# Patient Record
Sex: Female | Born: 1980
Health system: Southern US, Community
[De-identification: ages and names within clinical notes are randomized; demographics above are authoritative.]

## PROBLEM LIST (undated history)

## (undated) DIAGNOSIS — C801 Malignant (primary) neoplasm, unspecified: Secondary | ICD-10-CM

## (undated) DIAGNOSIS — F410 Panic disorder [episodic paroxysmal anxiety] without agoraphobia: Secondary | ICD-10-CM

## (undated) DIAGNOSIS — F319 Bipolar disorder, unspecified: Secondary | ICD-10-CM

## (undated) DIAGNOSIS — C539 Malignant neoplasm of cervix uteri, unspecified: Secondary | ICD-10-CM

## (undated) DIAGNOSIS — J45909 Unspecified asthma, uncomplicated: Secondary | ICD-10-CM

## (undated) DIAGNOSIS — F1910 Other psychoactive substance abuse, uncomplicated: Secondary | ICD-10-CM

## (undated) DIAGNOSIS — L309 Dermatitis, unspecified: Secondary | ICD-10-CM

## (undated) DIAGNOSIS — F4481 Dissociative identity disorder: Secondary | ICD-10-CM

## (undated) DIAGNOSIS — F3175 Bipolar disorder, in partial remission, most recent episode depressed: Secondary | ICD-10-CM

## (undated) DIAGNOSIS — B1920 Unspecified viral hepatitis C without hepatic coma: Secondary | ICD-10-CM

---
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
## 2012-05-29 ENCOUNTER — Emergency Department (HOSPITAL_COMMUNITY)
Admission: EM | Admit: 2012-05-29 | Discharge: 2012-05-29 | Discharge status: Against Medical Advice | Payer: Self-pay | Attending: Emergency Medicine | Admitting: Emergency Medicine

## 2012-05-29 DIAGNOSIS — Z0389 Encounter for observation for other suspected diseases and conditions ruled out: Secondary | ICD-10-CM | POA: Insufficient documentation

## 2012-05-29 NOTE — ED Notes (Signed)
Called to triage-- no answer

## 2012-05-29 NOTE — ED Notes (Signed)
Called to traige no answer

## 2012-06-06 ENCOUNTER — Emergency Department (HOSPITAL_COMMUNITY)
Admission: EM | Admit: 2012-06-06 | Discharge: 2012-06-07 | Disposition: A | Discharge status: Other Acute Inpatient Hospital | Payer: Self-pay | Attending: Emergency Medicine | Admitting: Emergency Medicine

## 2012-06-06 ENCOUNTER — Encounter (HOSPITAL_COMMUNITY): Payer: Self-pay | Admitting: *Deleted

## 2012-06-06 DIAGNOSIS — F1010 Alcohol abuse, uncomplicated: Secondary | ICD-10-CM | POA: Insufficient documentation

## 2012-06-06 DIAGNOSIS — K089 Disorder of teeth and supporting structures, unspecified: Secondary | ICD-10-CM | POA: Insufficient documentation

## 2012-06-06 DIAGNOSIS — F319 Bipolar disorder, unspecified: Secondary | ICD-10-CM | POA: Insufficient documentation

## 2012-06-06 DIAGNOSIS — J45909 Unspecified asthma, uncomplicated: Secondary | ICD-10-CM | POA: Insufficient documentation

## 2012-06-06 DIAGNOSIS — F1110 Opioid abuse, uncomplicated: Secondary | ICD-10-CM | POA: Insufficient documentation

## 2012-06-06 DIAGNOSIS — F17200 Nicotine dependence, unspecified, uncomplicated: Secondary | ICD-10-CM | POA: Insufficient documentation

## 2012-06-06 HISTORY — DX: Unspecified asthma, uncomplicated: J45.909

## 2012-06-06 HISTORY — DX: Bipolar disorder, unspecified: F31.9

## 2012-06-06 LAB — COMPREHENSIVE METABOLIC PANEL
AST: 26 U/L (ref 0–37)
BUN: 8 mg/dL (ref 6–23)
CO2: 26 mEq/L (ref 19–32)
Calcium: 9.2 mg/dL (ref 8.4–10.5)
Creatinine, Ser: 0.66 mg/dL (ref 0.50–1.10)
GFR calc Af Amer: >90 mL/min (ref >90)
GFR calc non Af Amer: >90 mL/min (ref >90)

## 2012-06-06 LAB — ACETAMINOPHEN LEVEL: Acetaminophen (Tylenol), Serum: <15 ug/mL (ref 10–30)

## 2012-06-06 LAB — CBC
MCH: 31.9 pg (ref 26.0–34.0)
MCV: 88.7 fL (ref 78.0–100.0)
Platelets: 165 10*3/uL (ref 150–400)
RBC: 4.33 MIL/uL (ref 3.87–5.11)

## 2012-06-06 LAB — RAPID URINE DRUG SCREEN, HOSP PERFORMED
Amphetamines: NOT DETECTED
Opiates: POSITIVE — AB

## 2012-06-06 LAB — ETHANOL: Alcohol, Ethyl (B): <11 mg/dL (ref 0–11)

## 2012-06-06 NOTE — ED Notes (Signed)
Pt and belongings wanded by security. Pt has 7 bags in cabinet 1.

## 2012-06-06 NOTE — ED Notes (Signed)
PA at bedside.

## 2012-06-06 NOTE — ED Notes (Signed)
Pt bought all of her belongings because she doesn't have anywhere to leave them.  Some were put in locker 28 and the rest security put in activity room in the psych ED.

## 2012-06-06 NOTE — ED Notes (Signed)
Pt reports she is tired of using heroin and wants to stop for her children.  She reports feeling sad because her ex husband's girlfriend is saying unkind things about her to her children and she wants to get to the point where she can get her kids back.  Last drink 7pm last night, last use of heroin 2pm today.  Pt usually drinks a pint of alcohol per day in addition to her heroin use.

## 2012-06-06 NOTE — ED Notes (Signed)
Pt coming from home with c/o detox. Pt states she wants detox from ETOH and heroin. Pt reports last drink was 7 pm last night, and last use heroin was today at 2 pm. Pt reports drinking 6 "Bootleggers" a day. Pt states 6 Bootleggers is approxmentaely a pint or fifth of liquor a day. Pt reports having a lot of stress with family and friends lately.

## 2012-06-06 NOTE — ED Notes (Signed)
Pt has been wanded by security. 

## 2012-06-06 NOTE — ED Provider Notes (Signed)
History     CSN: [id number]  Arrival date & time 06/06/12  1758   First MD Initiated Contact with Patient 06/06/12 2141      Chief Complaint  Patient presents with  . Medical Clearance   HPI  History provided by the patient. Patient is a 31 year old female who presents with requests for help with alcohol and heroine detox. Patient admits to long standing heroine abuse as well as heavy EtOH abuse. Patient does state that she has been through detox program for oral narcotics in the past one to 2 years ago. Patient states that she relapsed and has been using steadily for the past year. Patient does report trying to wean herself off her alcohol use over the past 2 days. Last drink was yesterday night at 7 PM. Patient does report using heroin earlier today. Patient does report having some diarrhea symptoms currently otherwise has no other complaints at present. Patient requesting help with detox. She denies any SI or HI.    Past Medical History  Diagnosis Date  . Degenerative disk disease   . Asthma   . Bipolar disorder     History reviewed. No pertinent past surgical history.  No family history on file.  History  Substance Use Topics  . Smoking status: Current Everyday Smoker  . Smokeless tobacco: Not on file  . Alcohol Use: Yes    OB History    Grav Para Term Preterm Abortions TAB SAB Ect Mult Living                  Review of Systems  Constitutional: Negative for fever and chills.  Respiratory: Negative for shortness of breath.   Cardiovascular: Negative for chest pain.  Gastrointestinal: Positive for nausea and diarrhea.  Neurological: Positive for tremors. Negative for weakness and numbness.  Psychiatric/Behavioral: Negative for suicidal ideas, hallucinations, confusion and self-injury.    Allergies  Review of patient's allergies indicates no known allergies.  Home Medications   Current Outpatient Rx  Name Route Sig Dispense Refill  . ALPRAZOLAM 1 MG PO TABS  Oral Take 1 mg by mouth every 4 (four) hours as needed. For anxiety.    Marland Kitchen TACROLIMUS 0.1 % EX OINT Topical Apply 1 application topically 2 (two) times daily.    . TRAZODONE HCL 150 MG PO TABS Oral Take 75 mg by mouth once.      BP 123/97  Pulse 85  Temp 98.5 F (36.9 C) (Oral)  Resp 20  SpO2 100%  LMP 05/15/2012  Physical Exam  Nursing note and vitals reviewed. Constitutional: She is oriented to person, place, and time. She appears well-developed and well-nourished. No distress.  HENT:  Head: Normocephalic.  Mouth/Throat:    Neck: Normal range of motion. Neck supple.  Cardiovascular: Normal rate and regular rhythm.   Pulmonary/Chest: Effort normal and breath sounds normal. No respiratory distress. She has no wheezes. She has no rales.  Abdominal: Soft. There is no tenderness. There is no rebound and no guarding.  Lymphadenopathy:    She has no cervical adenopathy.  Neurological: She is alert and oriented to person, place, and time.  Skin: Skin is warm and dry.  Psychiatric: Her behavior is normal. Her mood appears anxious. She exhibits a depressed mood. She expresses no homicidal and no suicidal ideation.    ED Course  Procedures    Dental Block Performed by: Angus Seller Authorized by: Angus Seller Consent: Verbal consent obtained. Risks and benefits: risks, benefits and alternatives were discussed Consent  given by: patient Patient identity confirmed: provided demographic data  Location: Right lower second molar  Local anesthetic: Bupivacaine 0.5% with epinephrine  Anesthetic total: 1.8 ml  Irrigation method: syringe  Patient tolerance: Patient tolerated the procedure well with no immediate complications. Pain improved.  Dental Block Performed by: Angus Seller Authorized by: Angus Seller Consent: Verbal consent obtained. Risks and benefits: risks, benefits and alternatives were discussed Consent given by: patient Patient identity confirmed:  provided demographic data  Location: Left lower first molar  Local anesthetic: Bupivacaine 0.5% with epinephrine  Anesthetic total: 1.8 ml  Irrigation method: syringe  Patient tolerance: Patient tolerated the procedure well with no immediate complications. Pain improved.   Results for orders placed during the hospital encounter of 06/06/12  CBC      Component Value Range   WBC 8.2  4.0 - 10.5 K/uL   RBC 4.33  3.87 - 5.11 MIL/uL   Hemoglobin 13.8  12.0 - 15.0 g/dL   HCT 86.5  78.4 - 69.6 %   MCV 88.7  78.0 - 100.0 fL   MCH 31.9  26.0 - 34.0 pg   MCHC 35.9  30.0 - 36.0 g/dL   RDW 29.5  28.4 - 13.2 %   Platelets 165  150 - 400 K/uL  COMPREHENSIVE METABOLIC PANEL      Component Value Range   Sodium 137  135 - 145 mEq/L   Potassium 3.4 (*) 3.5 - 5.1 mEq/L   Chloride 101  96 - 112 mEq/L   CO2 26  19 - 32 mEq/L   Glucose, Bld 102 (*) 70 - 99 mg/dL   BUN 8  6 - 23 mg/dL   Creatinine, Ser 4.40  0.50 - 1.10 mg/dL   Calcium 9.2  8.4 - 10.2 mg/dL   Total Protein 7.3  6.0 - 8.3 g/dL   Albumin 4.0  3.5 - 5.2 g/dL   AST 26  0 - 37 U/L   ALT 24  0 - 35 U/L   Alkaline Phosphatase 74  39 - 117 U/L   Total Bilirubin 0.7  0.3 - 1.2 mg/dL   GFR calc non Af Amer >90  >90 mL/min   GFR calc Af Amer >90  >90 mL/min  ETHANOL      Component Value Range   Alcohol, Ethyl (B) <11  0 - 11 mg/dL  ACETAMINOPHEN LEVEL      Component Value Range   Acetaminophen (Tylenol), Serum <15.0  10 - 30 ug/mL  URINE RAPID DRUG SCREEN (HOSP PERFORMED)      Component Value Range   Opiates POSITIVE (*) NONE DETECTED   Cocaine NONE DETECTED  NONE DETECTED   Benzodiazepines NONE DETECTED  NONE DETECTED   Amphetamines NONE DETECTED  NONE DETECTED   Tetrahydrocannabinol POSITIVE (*) NONE DETECTED   Barbiturates NONE DETECTED  NONE DETECTED  POCT PREGNANCY, URINE      Component Value Range   Preg Test, Ur NEGATIVE  NEGATIVE      1. Heroin abuse   2. Alcohol abuse       MDM  Patient seen and  evaluated. Patient denies SI or HI. Patient requesting help with detox from heroin and EtOH.  Patient was seen and evaluated by [facility] act team. They will plan for inpatient placement for heroin detox  Psych holding orders in place.  Patient does complain of lower dental pains. Patient given dental blocks with improvement of pain.   Angus Seller, [place] 06/07/12 [phone]

## 2012-06-07 ENCOUNTER — Encounter (HOSPITAL_COMMUNITY): Payer: Self-pay | Admitting: *Deleted

## 2012-06-07 ENCOUNTER — Inpatient Hospital Stay (HOSPITAL_COMMUNITY)
Admission: AD | Admit: 2012-06-07 | Discharge: 2012-06-15 | DRG: 897 | Disposition: A | Discharge status: Home / Self Care | Payer: Federal, State, Local not specified - Other | Attending: Psychiatry | Admitting: Psychiatry

## 2012-06-07 DIAGNOSIS — F3175 Bipolar disorder, in partial remission, most recent episode depressed: Secondary | ICD-10-CM

## 2012-06-07 DIAGNOSIS — F603 Borderline personality disorder: Secondary | ICD-10-CM

## 2012-06-07 DIAGNOSIS — F319 Bipolar disorder, unspecified: Secondary | ICD-10-CM | POA: Diagnosis present

## 2012-06-07 DIAGNOSIS — G4700 Insomnia, unspecified: Secondary | ICD-10-CM | POA: Diagnosis present

## 2012-06-07 DIAGNOSIS — F10239 Alcohol dependence with withdrawal, unspecified: Secondary | ICD-10-CM | POA: Diagnosis present

## 2012-06-07 DIAGNOSIS — F10939 Alcohol use, unspecified with withdrawal, unspecified: Secondary | ICD-10-CM | POA: Diagnosis present

## 2012-06-07 DIAGNOSIS — IMO0002 Reserved for concepts with insufficient information to code with codable children: Secondary | ICD-10-CM | POA: Diagnosis present

## 2012-06-07 DIAGNOSIS — F1020 Alcohol dependence, uncomplicated: Secondary | ICD-10-CM | POA: Diagnosis present

## 2012-06-07 DIAGNOSIS — F1920 Other psychoactive substance dependence, uncomplicated: Secondary | ICD-10-CM | POA: Diagnosis present

## 2012-06-07 DIAGNOSIS — F1120 Opioid dependence, uncomplicated: Secondary | ICD-10-CM

## 2012-06-07 DIAGNOSIS — F19939 Other psychoactive substance use, unspecified with withdrawal, unspecified: Principal | ICD-10-CM | POA: Diagnosis present

## 2012-06-07 DIAGNOSIS — G8929 Other chronic pain: Secondary | ICD-10-CM | POA: Diagnosis present

## 2012-06-07 HISTORY — DX: Bipolar disorder, in partial remission, most recent episode depressed: F31.75

## 2012-06-07 MED ORDER — HYDROXYZINE HCL 25 MG PO TABS
25.0000 mg | ORAL_TABLET | Freq: Four times a day (QID) | ORAL | Status: DC | PRN
  Administered 2012-06-07 – 2012-06-08 (×3): 25 mg via ORAL
  Filled 2012-06-07: qty 1

## 2012-06-07 MED ORDER — CLONIDINE HCL 0.1 MG PO TABS
0.1000 mg | ORAL_TABLET | Freq: Four times a day (QID) | ORAL | Status: DC
  Administered 2012-06-07: 0.1 mg via ORAL
  Filled 2012-06-07: qty 1

## 2012-06-07 MED ORDER — LOPERAMIDE HCL 2 MG PO CAPS
2.0000 mg | ORAL_CAPSULE | ORAL | Status: AC | PRN
  Administered 2012-06-07 – 2012-06-08 (×4): 2 mg via ORAL
  Filled 2012-06-07 (×2): qty 1

## 2012-06-07 MED ORDER — CHLORDIAZEPOXIDE HCL 25 MG PO CAPS
50.0000 mg | ORAL_CAPSULE | ORAL | Status: AC
  Administered 2012-06-07: 50 mg via ORAL

## 2012-06-07 MED ORDER — ZOLPIDEM TARTRATE 5 MG PO TABS: 5.0000 mg | ORAL_TABLET | Freq: Every evening | ORAL | Status: DC | PRN

## 2012-06-07 MED ORDER — NICOTINE 21 MG/24HR TD PT24
21.0000 mg | MEDICATED_PATCH | Freq: Every day | TRANSDERMAL | Status: DC
  Administered 2012-06-07: 21 mg via TRANSDERMAL
  Filled 2012-06-07: qty 1

## 2012-06-07 MED ORDER — CLONIDINE HCL 0.1 MG PO TABS
0.1000 mg | ORAL_TABLET | Freq: Four times a day (QID) | ORAL | Status: AC
  Administered 2012-06-07 – 2012-06-08 (×5): 0.1 mg via ORAL
  Filled 2012-06-07 (×8): qty 1

## 2012-06-07 MED ORDER — MAGNESIUM HYDROXIDE 400 MG/5ML PO SUSP
30.0000 mL | Freq: Every day | ORAL | Status: DC | PRN
  Administered 2012-06-13: 30 mL via ORAL

## 2012-06-07 MED ORDER — ONDANSETRON 4 MG PO TBDP
4.0000 mg | ORAL_TABLET | Freq: Four times a day (QID) | ORAL | Status: DC | PRN
  Administered 2012-06-08 – 2012-06-09 (×2): 4 mg via ORAL
  Filled 2012-06-07: qty 1

## 2012-06-07 MED ORDER — NAPROXEN 500 MG PO TABS
500.0000 mg | ORAL_TABLET | Freq: Two times a day (BID) | ORAL | Status: DC | PRN
  Administered 2012-06-07: 500 mg via ORAL
  Filled 2012-06-07: qty 1

## 2012-06-07 MED ORDER — LORAZEPAM 1 MG PO TABS
1.0000 mg | ORAL_TABLET | Freq: Three times a day (TID) | ORAL | Status: DC | PRN
  Administered 2012-06-07: 1 mg via ORAL
  Filled 2012-06-07: qty 1

## 2012-06-07 MED ORDER — ONDANSETRON 4 MG PO TBDP
4.0000 mg | ORAL_TABLET | Freq: Four times a day (QID) | ORAL | Status: DC | PRN
  Administered 2012-06-07: 4 mg via ORAL
  Filled 2012-06-07: qty 1

## 2012-06-07 MED ORDER — CLONIDINE HCL 0.1 MG PO TABS
0.1000 mg | ORAL_TABLET | ORAL | Status: AC
  Administered 2012-06-09 – 2012-06-10 (×4): 0.1 mg via ORAL
  Filled 2012-06-07 (×4): qty 1

## 2012-06-07 MED ORDER — ALUM & MAG HYDROXIDE-SIMETH 200-200-20 MG/5ML PO SUSP: 30.0000 mL | ORAL | Status: DC | PRN

## 2012-06-07 MED ORDER — HYDROXYZINE HCL 25 MG PO TABS
25.0000 mg | ORAL_TABLET | Freq: Four times a day (QID) | ORAL | Status: DC | PRN
  Administered 2012-06-07: 25 mg via ORAL
  Filled 2012-06-07: qty 1

## 2012-06-07 MED ORDER — DICYCLOMINE HCL 20 MG PO TABS
20.0000 mg | ORAL_TABLET | Freq: Four times a day (QID) | ORAL | Status: DC | PRN
  Administered 2012-06-07: 20 mg via ORAL
  Filled 2012-06-07: qty 1

## 2012-06-07 MED ORDER — VITAMIN B-1 100 MG PO TABS
100.0000 mg | ORAL_TABLET | Freq: Every day | ORAL | Status: DC
  Administered 2012-06-08 – 2012-06-14 (×7): 100 mg via ORAL
  Filled 2012-06-07 (×9): qty 1

## 2012-06-07 MED ORDER — CLONIDINE HCL 0.1 MG PO TABS: 0.1000 mg | ORAL_TABLET | Freq: Every day | ORAL | Status: DC

## 2012-06-07 MED ORDER — METHOCARBAMOL 500 MG PO TABS
500.0000 mg | ORAL_TABLET | Freq: Three times a day (TID) | ORAL | Status: AC | PRN
  Administered 2012-06-07 – 2012-06-12 (×7): 500 mg via ORAL
  Filled 2012-06-07 (×8): qty 1

## 2012-06-07 MED ORDER — CLONIDINE HCL 0.1 MG PO TABS: 0.1000 mg | ORAL_TABLET | ORAL | Status: DC

## 2012-06-07 MED ORDER — METHOCARBAMOL 500 MG PO TABS
500.0000 mg | ORAL_TABLET | Freq: Three times a day (TID) | ORAL | Status: DC | PRN
  Administered 2012-06-07: 500 mg via ORAL
  Filled 2012-06-07: qty 1

## 2012-06-07 MED ORDER — ADULT MULTIVITAMIN W/MINERALS CH
1.0000 | ORAL_TABLET | Freq: Every day | ORAL | Status: DC
  Administered 2012-06-08 – 2012-06-13 (×5): 1 via ORAL
  Filled 2012-06-07 (×9): qty 1

## 2012-06-07 MED ORDER — CLONIDINE HCL 0.1 MG PO TABS
0.1000 mg | ORAL_TABLET | Freq: Every day | ORAL | Status: AC
  Administered 2012-06-11 – 2012-06-12 (×2): 0.1 mg via ORAL
  Filled 2012-06-07 (×2): qty 1

## 2012-06-07 MED ORDER — NAPROXEN 500 MG PO TABS
500.0000 mg | ORAL_TABLET | Freq: Two times a day (BID) | ORAL | Status: AC | PRN
  Administered 2012-06-07 – 2012-06-11 (×6): 500 mg via ORAL
  Filled 2012-06-07 (×6): qty 1

## 2012-06-07 MED ORDER — BUPIVACAINE-EPINEPHRINE PF 0.5-1:200000 % IJ SOLN
1.8000 mL | Freq: Once | INTRAMUSCULAR | Status: AC
  Administered 2012-06-07: 9 mg
  Filled 2012-06-07 (×2): qty 1.8

## 2012-06-07 MED ORDER — CHLORDIAZEPOXIDE HCL 25 MG PO CAPS
ORAL_CAPSULE | ORAL | Status: AC
  Filled 2012-06-07: qty 2

## 2012-06-07 MED ORDER — THIAMINE HCL 100 MG/ML IJ SOLN: 100.0000 mg | Freq: Once | INTRAMUSCULAR | Status: DC

## 2012-06-07 MED ORDER — BUPIVACAINE-EPINEPHRINE PF 0.5-1:200000 % IJ SOLN
1.8000 mL | Freq: Once | INTRAMUSCULAR | Status: AC
  Administered 2012-06-07: 9 mg

## 2012-06-07 MED ORDER — LOPERAMIDE HCL 2 MG PO CAPS
2.0000 mg | ORAL_CAPSULE | ORAL | Status: DC | PRN
  Administered 2012-06-07: 4 mg via ORAL
  Filled 2012-06-07: qty 2

## 2012-06-07 MED ORDER — ACETAMINOPHEN 325 MG PO TABS
650.0000 mg | ORAL_TABLET | Freq: Four times a day (QID) | ORAL | Status: DC | PRN
  Administered 2012-06-08 – 2012-06-13 (×5): 650 mg via ORAL

## 2012-06-07 MED ORDER — ONDANSETRON 4 MG PO TBDP: 4.0000 mg | ORAL_TABLET | Freq: Four times a day (QID) | ORAL | Status: DC | PRN

## 2012-06-07 MED ORDER — DICYCLOMINE HCL 20 MG PO TABS
20.0000 mg | ORAL_TABLET | Freq: Four times a day (QID) | ORAL | Status: AC | PRN
  Administered 2012-06-07 – 2012-06-08 (×3): 20 mg via ORAL
  Filled 2012-06-07 (×3): qty 1

## 2012-06-07 MED ORDER — CHLORDIAZEPOXIDE HCL 25 MG PO CAPS
25.0000 mg | ORAL_CAPSULE | Freq: Four times a day (QID) | ORAL | Status: DC | PRN
  Administered 2012-06-08 (×2): 25 mg via ORAL
  Filled 2012-06-07 (×2): qty 1

## 2012-06-07 NOTE — Progress Notes (Signed)
Patient ID: Alicia Golden, female   DOB: 1980-12-27, 31 y.o.   MRN: [id number] Pt is a 31 year old female who states she has been on herion and etoh since last November. States she is tired of being on drugs and her probation officer recommended she get into a 28 day program. Pt states she has two court dates pending for larcency and the other one she forgot what it is for. Denies any significant medical or surgical history. States seh has been in a short term drug rehab times three days at [facility],the patient states she has three children ages 88,7 and 56 who all live with the dad. Pt has a 8th grade education.

## 2012-06-07 NOTE — BH Assessment (Signed)
Assessment Note   Alicia Golden is a 31 y.o. female who presents to [facility] for detox.  Pt denies SI/HI/Psych.  Pt is currently using Heroin, 1 gram daily, last use 06/06/12; Alcohol, 1 pint daily, last use 06/06/12; Benzos(Xanax) 1-2 pills daily, last use 3 days ago.  Pt also uses THC(occasionally), 1-2 "hits" or a "bowl",if available.  Pt has been using Alcohol and THC since age 66, only started using Heroin in 18-Oct-2011 when her father passed away.  Pt admitted that she has SI hx and cut wrist last yr when father died--"I did it for attention".  Pt was admitted to [facility] for SI/SA 2012 and has been using [facility](2013) for Methadone Tx, says finished treatment in August 2013.  Pt has criminal chgs for Larceny and selling rx meds, court date is 06/19/12.  Pt has visible tracks marks on both arms from Heroin use and is c/o w/d sxs: Toothaches(some teeth are broken due to Heroin use), Sweats, Restlessness, "Skin Crawling" and Stomach Cramps.    Axis I: Polysub Dep  Axis II: Deferred Axis III:  Past Medical History  Diagnosis Date  . Degenerative disk disease   . Asthma   . Bipolar disorder    Axis IV: other psychosocial or environmental problems, problems related to legal system/crime, problems related to social environment and problems with primary support group Axis V: 51-60 moderate symptoms  Past Medical History:  Past Medical History  Diagnosis Date  . Degenerative disk disease   . Asthma   . Bipolar disorder     History reviewed. No pertinent past surgical history.  Family History: No family history on file.  Social History:  reports that she has been smoking.  She does not have any smokeless tobacco history on file. She reports that she drinks alcohol. She reports that she uses illicit drugs (IV, Marijuana, and Heroin).  Additional Social History:  Alcohol / Drug Use Pain Medications: None  Prescriptions: None  Over the Counter: None  History of alcohol / drug use?:  Yes Longest period of sobriety (when/how long): None  Negative Consequences of Use: Legal;Personal relationships Withdrawal Symptoms: Cramps;Sweats Substance #1 Name of Substance 1: Heroin  1 - Age of First Use: 30 YOF 1 - Amount (size/oz): 1 Gram  1 - Frequency: Daily  1 - Duration: 10 Mos  1 - Last Use / Amount: 06/06/12 Substance #2 Name of Substance 2: Alcohol  2 - Age of First Use: 17 YOF 2 - Amount (size/oz): 1 Pint  2 - Frequency: Daily  2 - Duration: On-going  2 - Last Use / Amount: 06/06/12 Substance #3 Name of Substance 3: THC  3 - Age of First Use: 17 YOF  3 - Amount (size/oz): 1-2 "Hits" or 1 Bowl  3 - Frequency: Occasionally  3 - Duration: On-going  3 - Last Use / Amount: Unk  Substance #4 Name of Substance 4: Benzo--Xanax  4 - Age of First Use: 23 YOF 4 - Amount (size/oz): 1-2 Pills  4 - Frequency: Daily  4 - Duration: On-going  4 - Last Use / Amount: 3 Days   CIWA: CIWA-Ar BP: 119/98 mmHg Pulse Rate: 77  Nausea and Vomiting: no nausea and no vomiting Tactile Disturbances: none Tremor: no tremor Auditory Disturbances: not present Paroxysmal Sweats: two Visual Disturbances: not present Anxiety: two Headache, Fullness in Head: none present Agitation: normal activity Orientation and Clouding of Sensorium: oriented and can do serial additions CIWA-Ar Total: 4  COWS: Clinical Opiate Withdrawal  Scale (COWS) Resting Pulse Rate: Pulse Rate 80 or below Sweating: No report of chills or flushing Restlessness: Reports difficulty sitting still, but is able to do so Pupil Size: Pupils possibly larger than normal for room light Bone or Joint Aches: Mild diffuse discomfort Runny Nose or Tearing: Not present GI Upset: No GI symptoms Tremor: Tremor can be felt, but not observed Yawning: No yawning Anxiety or Irritability: Patient reports increasing irritability or anxiousness Gooseflesh Skin: Skin is smooth COWS Total Score: 5   Allergies: No Known  Allergies  Home Medications:  (Not in a hospital admission)  OB/GYN Status:  Patient's last menstrual period was 05/15/2012.  General Assessment Data Location of Assessment: [facility] ED Living Arrangements: Alone Can pt return to current living arrangement?: Yes Admission Status: Voluntary Is patient capable of signing voluntary admission?: Yes Transfer from: Acute Hospital Referral Source: MD  Education Status Is patient currently in school?: No Current Grade: None  Highest grade of school patient has completed: None  Name of school: None  Contact person: None   Risk to self Suicidal Ideation: No Suicidal Intent: No Is patient at risk for suicide?: No Suicidal Plan?: No Access to Means: No What has been your use of drugs/alcohol within the last 12 months?: Abusing Heroin, Alcohol, THC, Xanax  Previous Attempts/Gestures: Yes How many times?: 1  Other Self Harm Risks: None  Triggers for Past Attempts: Other (Comment) (Father died ) Intentional Self Injurious Behavior: None Family Suicide History: No Recent stressful life event(s): Other (Comment) (Chronic SA, Father died in 04-04-11) Persecutory voices/beliefs?: No Depression: Yes Depression Symptoms: Loss of interest in usual pleasures;Feeling worthless/self pity Substance abuse history and/or treatment for substance abuse?: Yes Suicide prevention information given to non-admitted patients: Not applicable  Risk to Others Homicidal Ideation: No Thoughts of Harm to Others: No Current Homicidal Intent: No Current Homicidal Plan: No Access to Homicidal Means: No Identified Victim: None  History of harm to others?: No Assessment of Violence: None Noted Violent Behavior Description: None  Does patient have access to weapons?: No Criminal Charges Pending?: Yes Describe Pending Criminal Charges: Larceny; Selling Prescribed Meds  Does patient have a court date: Yes Court Date: 06/19/12  Psychosis Hallucinations: None  noted Delusions: None noted  Mental Status Report Appear/Hygiene: Other (Comment) (Appropriate ) Eye Contact: Good Motor Activity: Restlessness Speech: Logical/coherent Level of Consciousness: Quiet/awake Mood: Depressed Affect: Depressed Anxiety Level: Minimal Judgement: Unimpaired Orientation: Person;Place;Time;Situation Obsessive Compulsive Thoughts/Behaviors: None  Cognitive Functioning Concentration: Normal Memory: Recent Intact;Remote Intact IQ: Average Insight: Fair Impulse Control: Fair Appetite: Fair Weight Loss: 0  Weight Gain: 0  Sleep: No Change Total Hours of Sleep: 6  Vegetative Symptoms: None  ADLScreening [facility] Assessment Services) Patient's cognitive ability adequate to safely complete daily activities?: Yes Patient able to express need for assistance with ADLs?: Yes Independently performs ADLs?: Yes (appropriate for developmental age)  Abuse/Neglect [facility]) Physical Abuse: Denies Verbal Abuse: Denies Sexual Abuse: Denies  Prior Inpatient Therapy Prior Inpatient Therapy: Yes Prior Therapy Dates: 04/04/11 Prior Therapy Facilty/Provider(s): [facility]  Reason for Treatment: SI/SA  Prior Outpatient Therapy Prior Outpatient Therapy: Yes Prior Therapy Dates: April 03, 2012 Prior Therapy Facilty/Provider(s): [facility] Reason for Treatment: Methadone Treatment   ADL Screening (condition at time of admission) Patient's cognitive ability adequate to safely complete daily activities?: Yes Patient able to express need for assistance with ADLs?: Yes Independently performs ADLs?: Yes (appropriate for developmental age) Weakness of Legs: None Weakness of Arms/Hands: None  Home Assistive Devices/Equipment Home Assistive Devices/Equipment: None  Therapy Consults (therapy consults require a physician order) PT Evaluation Needed: No OT Evalulation Needed: No SLP Evaluation Needed: No Abuse/Neglect Assessment (Assessment to be complete while patient is  alone) Physical Abuse: Denies Verbal Abuse: Denies Sexual Abuse: Denies Exploitation of patient/patient's resources: Denies Self-Neglect: Denies Values / Beliefs Cultural Requests During Hospitalization: None Spiritual Requests During Hospitalization: None Consults Spiritual Care Consult Needed: No Social Work Consult Needed: No Merchant navy officer (For Healthcare) Advance Directive: Patient does not have advance directive;Patient would not like information Pre-existing out of facility DNR order (yellow form or pink MOST form): No Nutrition Screen Diet: Regular Unintentional weight loss greater than 10lbs within the last month: No Problems chewing or swallowing foods and/or liquids: No Home Tube Feeding or Total Parenteral Nutrition (TPN): No Patient appears severely malnourished: No Pregnant or Lactating: No  Additional Information 1:1 In Past 12 Months?: No CIRT Risk: No Elopement Risk: No Does patient have medical clearance?: Yes     Disposition:  Disposition Disposition of Patient: Inpatient treatment program;Referred to [facility] ) Type of inpatient treatment program: Adult Patient referred to: Other (Comment) [facility] )  On Site Evaluation by:   Reviewed with Physician:     Murrell Redden 06/07/2012 2:11 AM

## 2012-06-07 NOTE — ED Notes (Signed)
Notified Security that patient is ready for transport to [facility]. They are unable to transport at this time.

## 2012-06-07 NOTE — BH Assessment (Signed)
Assessment Note   Alicia Golden is an 31 y.o. female. Requesting Detox.  Pt denies SI/HI/Psych. Pt is currently using Heroin, 1 gram daily, last use 06/06/12; Alcohol, 1 pint daily, last use 06/06/12; Benzos(Xanax) 1-2 pills daily, last use 3 days ago. Pt also uses THC(occasionally), 1-2 "hits" or a "bowl",if available. Pt has been using Alcohol and THC since age 51, only started using Heroin in 2011/09/26 when her father passed away. Pt admitted that she has SI hx and cut wrist last yr when father died--"I did it for attention". Pt was admitted to [facility] for SI/SA 2012 and has been using [facility](2013) for Methadone Tx, says finished treatment in August 2013. Pt has criminal chgs for Larceny and selling rx meds, court date is 06/19/12. Pt has visible tracks marks on both arms from Heroin use and is c/o w/d sxs: Toothaches(some teeth are broken due to Heroin use), Sweats, Restlessness, "Skin Crawling" and Stomach Cramps.   Pt accepted to [facility] by Verne Spurr, PA to Dr. Koren Shiver. Completed support documentation and faxed to [facility]. Updated RN & EDP. Pt is voluntary and to be transported via security.  Axis I: Opioid Dependence; Polysubstance Abuse Axis II: Deferred Axis III:  Past Medical History  Diagnosis Date  . Degenerative disk disease   . Asthma   . Bipolar disorder    Axis IV: other psychosocial or environmental problems Axis V: 41-50 serious symptoms  Past Medical History:  Past Medical History  Diagnosis Date  . Degenerative disk disease   . Asthma   . Bipolar disorder     History reviewed. No pertinent past surgical history.  Family History: No family history on file.  Social History:  reports that she has been smoking.  She does not have any smokeless tobacco history on file. She reports that she drinks alcohol. She reports that she uses illicit drugs (IV, Marijuana, and Heroin).  Additional Social History:  Alcohol / Drug Use Pain Medications: None    Prescriptions: None  Over the Counter: None  History of alcohol / drug use?: Yes Longest period of sobriety (when/how long): None  Negative Consequences of Use: Legal;Personal relationships Withdrawal Symptoms: Cramps;Sweats Substance #1 Name of Substance 1: Heroin  1 - Age of First Use: 30 YOF 1 - Amount (size/oz): 1 Gram  1 - Frequency: Daily  1 - Duration: 10 Mos  1 - Last Use / Amount: 06/06/12 Substance #2 Name of Substance 2: Alcohol  2 - Age of First Use: 17 YOF 2 - Amount (size/oz): 1 Pint  2 - Frequency: Daily  2 - Duration: On-going  2 - Last Use / Amount: 06/06/12 Substance #3 Name of Substance 3: THC  3 - Age of First Use: 17 YOF  3 - Amount (size/oz): 1-2 "Hits" or 1 Bowl  3 - Frequency: Occasionally  3 - Duration: On-going  3 - Last Use / Amount: Unk  Substance #4 Name of Substance 4: Benzo--Xanax  4 - Age of First Use: 23 YOF 4 - Amount (size/oz): 1-2 Pills  4 - Frequency: Daily  4 - Duration: On-going  4 - Last Use / Amount: 3 Days   CIWA: CIWA-Ar BP: 106/60 mmHg Pulse Rate: 76  Nausea and Vomiting: mild nausea with no vomiting Tactile Disturbances: none Tremor: no tremor Auditory Disturbances: not present Paroxysmal Sweats: no sweat visible Visual Disturbances: not present Anxiety: two Headache, Fullness in Head: none present Agitation: somewhat more than normal activity Orientation and Clouding of Sensorium: oriented and can  do serial additions CIWA-Ar Total: 4  COWS: Clinical Opiate Withdrawal Scale (COWS) Resting Pulse Rate: Pulse Rate 80 or below Sweating: No report of chills or flushing Restlessness: Able to sit still Pupil Size: Pupils pinned or normal size for room light Bone or Joint Aches: Mild diffuse discomfort Runny Nose or Tearing: Not present GI Upset: Stomach cramps Tremor: No tremor Yawning: No yawning Anxiety or Irritability: Patient reports increasing irritability or anxiousness Gooseflesh Skin: Skin is smooth COWS  Total Score: 3   Allergies: No Known Allergies  Home Medications:  (Not in a hospital admission)  OB/GYN Status:  Patient's last menstrual period was 05/15/2012.  General Assessment Data Location of Assessment: [facility] ED Living Arrangements: Alone Can pt return to current living arrangement?: Yes Admission Status: Voluntary Is patient capable of signing voluntary admission?: Yes Transfer from: Acute Hospital Referral Source: Self/Family/Friend  Education Status Is patient currently in school?: No Current Grade: None  Highest grade of school patient has completed: None  Name of school: None  Contact person: None   Risk to self Suicidal Ideation: No Suicidal Intent: No Is patient at risk for suicide?: No Suicidal Plan?: No Access to Means: No What has been your use of drugs/alcohol within the last 12 months?: Abusing Heroin, Alcohol, THC, Xanax  Previous Attempts/Gestures: Yes How many times?: 1  Other Self Harm Risks: None  Triggers for Past Attempts: Other (Comment) (Father died ) Intentional Self Injurious Behavior: None Family Suicide History: No Recent stressful life event(s): Other (Comment) (Chronic SA, Father died in 2011-03-27) Persecutory voices/beliefs?: No Depression: Yes Depression Symptoms: Loss of interest in usual pleasures;Feeling worthless/self pity Substance abuse history and/or treatment for substance abuse?: Yes Suicide prevention information given to non-admitted patients: Not applicable  Risk to Others Homicidal Ideation: No Thoughts of Harm to Others: No Current Homicidal Intent: No Current Homicidal Plan: No Access to Homicidal Means: No Identified Victim: None  History of harm to others?: No Assessment of Violence: None Noted Violent Behavior Description: None  Does patient have access to weapons?: No Criminal Charges Pending?: Yes Describe Pending Criminal Charges: Larceny; Selling Prescribed Meds  Does patient have a court date: Yes Court  Date: 06/19/12  Psychosis Hallucinations: None noted Delusions: None noted  Mental Status Report Appear/Hygiene: Other (Comment) (Appropriate ) Eye Contact: Good Motor Activity: Agitation Speech: Logical/coherent Level of Consciousness: Quiet/awake Mood: Depressed Affect: Depressed Anxiety Level: Minimal Judgement: Unimpaired Orientation: Person;Place;Time;Situation Obsessive Compulsive Thoughts/Behaviors: None  Cognitive Functioning Concentration: Normal Memory: Recent Intact;Remote Intact IQ: Average Insight: Fair Impulse Control: Fair Appetite: Fair Weight Loss: 0  Weight Gain: 0  Sleep: No Change Total Hours of Sleep: 6  Vegetative Symptoms: None  ADLScreening [facility] Assessment Services) Patient's cognitive ability adequate to safely complete daily activities?: Yes Patient able to express need for assistance with ADLs?: Yes Independently performs ADLs?: Yes (appropriate for developmental age)  Abuse/Neglect [facility]) Physical Abuse: Denies Verbal Abuse: Denies Sexual Abuse: Denies  Prior Inpatient Therapy Prior Inpatient Therapy: Yes Prior Therapy Dates: 03/27/11 Prior Therapy Facilty/Provider(s): [facility]  Reason for Treatment: SI/SA  Prior Outpatient Therapy Prior Outpatient Therapy: Yes Prior Therapy Dates: 2012/03/26 Prior Therapy Facilty/Provider(s): [facility] Reason for Treatment: Methadone Treatment   ADL Screening (condition at time of admission) Patient's cognitive ability adequate to safely complete daily activities?: Yes Patient able to express need for assistance with ADLs?: Yes Independently performs ADLs?: Yes (appropriate for developmental age) Weakness of Legs: None Weakness of Arms/Hands: None  Home Assistive Devices/Equipment Home Assistive Devices/Equipment: None  Therapy Consults (therapy consults require a physician order) PT Evaluation Needed: No OT Evalulation Needed: No SLP Evaluation Needed: No Abuse/Neglect Assessment  (Assessment to be complete while patient is alone) Physical Abuse: Denies Verbal Abuse: Denies Sexual Abuse: Denies Exploitation of patient/patient's resources: Denies Self-Neglect: Denies Values / Beliefs Cultural Requests During Hospitalization: None Spiritual Requests During Hospitalization: None Consults Spiritual Care Consult Needed: No Social Work Consult Needed: No Merchant navy officer (For Healthcare) Advance Directive: Patient does not have advance directive;Patient would not like information Pre-existing out of facility DNR order (yellow form or pink MOST form): No Nutrition Screen Diet: Regular Unintentional weight loss greater than 10lbs within the last month: No Problems chewing or swallowing foods and/or liquids: No Home Tube Feeding or Total Parenteral Nutrition (TPN): No Patient appears severely malnourished: No Pregnant or Lactating: No  Additional Information 1:1 In Past 12 Months?: No CIRT Risk: No Elopement Risk: No Does patient have medical clearance?: Yes     Disposition:  Disposition Disposition of Patient: Inpatient treatment program ([facility]: [facility] (307-1)) Type of inpatient treatment program: Adult Patient referred to: Other (Comment) (Accepted [facility]: [facility] (307-1))  On Site Evaluation by:   Reviewed with Physician:     Romeo Apple 06/07/2012 8:54 AM

## 2012-06-07 NOTE — [facility] Counselor (Signed)
Beatrix Shipper, assessment counselor at [facility], submitted Pt for admission to [facility]. Consulted with Lutricia Feil, AC who confirmed bed availability. Verne Spurr, PA reviewed clinical information and accepted Pt to the service of Dr. Thomasene Lot, room 307-1. Communicated this information to [employer].  Harlin Rain Patsy Baltimore, LPC

## 2012-06-07 NOTE — H&P (Signed)
Psychiatric Admission Assessment Adult  Patient Identification:  Alicia Golden Date of Evaluation:  06/07/2012 Chief Complaint:  DETOX History of Present Illness:: Pt is a 31 y/o WF who has been evaluated and medically cleared per [name] EDP for matriculation into drug/alcohol detox program. The patients drug of choice is Heroin. Patient last used Heroin 1 gm at 2 pm on 8/13. Pt also drinks a fifth of Vodka daily with last drink at 7 pm 8/12, pt gives a history of daily heroin use at a gram/day. Patient also smokes marijuana and has a positive UDS for opiates and THC. Patient has a prior h/o of drug detox from oxycodone 05/2011 at [facility]. Pt notes positive family h/o of polysubstance abuse i.e. Both parents alcoholics with her mother using crack in the past. Pt also has a sister and brother who are both alcoholics. Patient is single, unemployed G7,P3,A0,MC4 and living with her mother. The patient's children currently live with their father. Pt also uses tobacco products i.e.1 1/2 PPD smoker. Pt also has a psychiatric hx to include Bipolar  D/O dx 5 years ago but doesn't have a Psychiatric provider since her last hospitalization for a suicide attempt ( cutting her wrist) 07/2011 at [facility]. Pt has been non compliant with her then rx psychotropics to include Xanax, Lithium, Neurontin and Restoril. Pt is dealing with some anxiety concerns, which she feel is coming due to her looming detox and rates her anxiety sx 8/10. Pt feels depressed due to not having her children around with some hopelessness. Pt denies racing thoughts, decreased concentration, insomnia, delusional thoughts, homicidal ideations, visual and or auditory hallucinations or h/o of PTSD. Pt also denies any h/o of sexual abuse as a child.  Mood Symptoms:  Concentration, Depression, Guilt, Hopelessness, Sadness, Worthlessness, Depression Symptoms:  depressed mood, psychomotor agitation, hopelessness, anxiety, panic  attacks, decreased appetite, (Hypo) Manic Symptoms:  Irritable Mood, Anxiety Symptoms:  Panic Symptoms, Psychotic Symptoms:  none  PTSD Symptoms: none  Past Psychiatric History: Diagnosis:  Hospitalizations:  Outpatient Care:  Substance Abuse Care:  Self-Mutilation:  Suicidal Attempts:  Violent Behaviors:   Past Medical History:   Past Medical History  Diagnosis Date  . Degenerative disk disease   . Asthma   . Bipolar disorder    None. Allergies:  No Known Allergies PTA Medications:  (Not in a hospital admission)  Previous Psychotropic Medications:  Medication/Dose  Alprazolam  restoril  lithium  neurontin         Substance Abuse History in the last 12 months: Substance Age of 1st Use Last Use Amount Specific Type  Nicotine   See HPI   Alcohol   See HPI   Cannabis   See HPI   Opiates   See HPI   Cocaine      Methamphetamines      LSD      Ecstasy      Benzodiazepines      Caffeine      Inhalants      Others: Heroin   See HPI                      Consequences of Substance Abuse: Withdrawal Symptoms:   Tremors flushing, runny nose, psychmotor agitiation  Social History: Current Place of Residence:   Place of Birth:   Family Members: Marital Status:  Single Children:  Sons:  Daughters: Relationships: Education:  eight grade education Educational Problems/Performance: Religious Beliefs/Practices: History of Abuse (Emotional/Phsycial/Sexual) Occupational Experiences; Military History:  None. Legal History: Hobbies/Interests:  Family History:  No family history on file.  Mental Status Examination/Evaluation: Objective:  Appearance: Disheveled  Eye Contact::  Good  Speech:  Clear and Coherent  Volume:  Normal  Mood:  Anxious  Affect:  Appropriate  Thought Process:  Goal Directed  Orientation:  Full  Thought Content:  wanting to address detox concerns but not mgmt of psychiatric illness  Suicidal Thoughts:  No  Homicidal Thoughts:   No  Memory:  Immediate;   Fair  Judgement:  Fair  Insight:  Lacking  Psychomotor Activity:  Restlessness  Concentration:  Good  Recall:  Fair  Akathisia:  Yes  Handed:  Right  AIMS (if indicated):     Assets:  Social Support  Sleep:       Laboratory/X-Ray Psychological Evaluation(s)      Assessment:    AXIS I:  Bipolar, mixed, Generalized Anxiety Disorder and Substance Abuse AXIS II:  Borderline Personality Dis. AXIS III:  1) DDD 2)Asthma Past Medical History  Diagnosis Date  . Degenerative disk disease   . Asthma   . Bipolar disorder    AXIS IV:  economic problems and problems related to social environment AXIS V:  31-40 impairment in reality testing  Treatment Plan/Recommendations: 1) Rec placement  in patient  drug/alcohol detox for Heroin, Alcohol and Marijuana use  2) Rec Psycho therapy and continued counseling in regards to substance abuse and un treated Bipolar D/O 3) Rec utilization of psychotropic therapy deemed appropriate for mgmt of Bipolar D/O per psychiatrist reccomendations in conjunction with Pt compliance concerns.  Treatment Plan Summary: Medication management of acute detox psycho motor agitation prior to transition to inpatient detox facility  Current Medications:  Current Facility-Administered Medications  Medication Dose Route Frequency Provider Last Rate Last Dose  . alum & mag hydroxide-simeth (MAALOX/MYLANTA) 200-200-20 MG/5ML suspension 30 mL  30 mL Oral PRN Angus Seller, PA      . Bupivacaine-Epinephrine PF (MARCAINE W/ EPI (PF)) 0.5-1:200000 % injection 9 mg  1.8 mL Infiltration Once Angus Seller, PA      . cloNIDine (CATAPRES) tablet 0.1 mg  0.1 mg Oral QID Angus Seller, PA       Followed by  . cloNIDine (CATAPRES) tablet 0.1 mg  0.1 mg Oral BH-qamhs Angus Seller, PA       Followed by  . cloNIDine (CATAPRES) tablet 0.1 mg  0.1 mg Oral QAC breakfast Angus Seller, PA      . dicyclomine (BENTYL) tablet 20 mg  20 mg Oral Q6H PRN  Angus Seller, PA      . hydrOXYzine (ATARAX/VISTARIL) tablet 25 mg  25 mg Oral Q6H PRN Angus Seller, PA      . loperamide (IMODIUM) capsule 2-4 mg  2-4 mg Oral PRN Angus Seller, PA      . LORazepam (ATIVAN) tablet 1 mg  1 mg Oral Q8H PRN Angus Seller, PA      . methocarbamol (ROBAXIN) tablet 500 mg  500 mg Oral Q8H PRN Angus Seller, PA      . naproxen (NAPROSYN) tablet 500 mg  500 mg Oral BID PRN Angus Seller, PA      . nicotine (NICODERM CQ - dosed in mg/24 hours) patch 21 mg  21 mg Transdermal Daily Phill Mutter Dammen, PA      . ondansetron (ZOFRAN-ODT) disintegrating tablet 4 mg  4 mg Oral Q6H PRN Angus Seller, PA      .  zolpidem (AMBIEN) tablet 5 mg  5 mg Oral QHS PRN Angus Seller, PA       Current Outpatient Prescriptions  Medication Sig Dispense Refill  . ALPRAZolam (XANAX) 1 MG tablet Take 1 mg by mouth every 4 (four) hours as needed. For anxiety.      . tacrolimus (PROTOPIC) 0.1 % ointment Apply 1 application topically 2 (two) times daily.      . traZODone (DESYREL) 150 MG tablet Take 75 mg by mouth once.        Observation Level/Precautions:  Detox  Laboratory:  UDS, Alcohol level, UHCG, CBC, BMP and Acetaminophen level  Psychotherapy:    Medications:    Routine PRN Medications:  Yes  Consultations:    Discharge Concerns:    Other:     Harish Bram E 8/14/20132:29 AM

## 2012-06-08 DIAGNOSIS — F1120 Opioid dependence, uncomplicated: Secondary | ICD-10-CM

## 2012-06-08 MED ORDER — LITHIUM CARBONATE 300 MG PO CAPS
300.0000 mg | ORAL_CAPSULE | Freq: Two times a day (BID) | ORAL | Status: DC
  Administered 2012-06-08 – 2012-06-12 (×8): 300 mg via ORAL
  Filled 2012-06-08 (×11): qty 1

## 2012-06-08 MED ORDER — CHLORDIAZEPOXIDE HCL 25 MG PO CAPS
25.0000 mg | ORAL_CAPSULE | Freq: Three times a day (TID) | ORAL | Status: AC
  Administered 2012-06-09 – 2012-06-10 (×3): 25 mg via ORAL
  Filled 2012-06-08 (×3): qty 1

## 2012-06-08 MED ORDER — NICOTINE POLACRILEX 2 MG MT GUM
2.0000 mg | CHEWING_GUM | OROMUCOSAL | Status: DC | PRN
  Administered 2012-06-10 – 2012-06-13 (×2): 2 mg via ORAL
  Filled 2012-06-08 (×2): qty 1

## 2012-06-08 MED ORDER — TRAZODONE HCL 50 MG PO TABS
50.0000 mg | ORAL_TABLET | Freq: Every evening | ORAL | Status: DC | PRN
  Administered 2012-06-08 (×2): 50 mg via ORAL
  Filled 2012-06-08 (×5): qty 1

## 2012-06-08 MED ORDER — GABAPENTIN 100 MG PO CAPS
200.0000 mg | ORAL_CAPSULE | Freq: Three times a day (TID) | ORAL | Status: DC
  Administered 2012-06-08 – 2012-06-09 (×3): 200 mg via ORAL
  Filled 2012-06-08 (×7): qty 2

## 2012-06-08 MED ORDER — CHLORDIAZEPOXIDE HCL 25 MG PO CAPS
25.0000 mg | ORAL_CAPSULE | Freq: Every day | ORAL | Status: AC
  Administered 2012-06-12: 25 mg via ORAL
  Filled 2012-06-08: qty 1

## 2012-06-08 MED ORDER — CHLORDIAZEPOXIDE HCL 25 MG PO CAPS
25.0000 mg | ORAL_CAPSULE | ORAL | Status: AC
  Administered 2012-06-10 – 2012-06-11 (×2): 25 mg via ORAL
  Filled 2012-06-08 (×2): qty 1

## 2012-06-08 MED ORDER — CHLORDIAZEPOXIDE HCL 25 MG PO CAPS
25.0000 mg | ORAL_CAPSULE | Freq: Four times a day (QID) | ORAL | Status: AC
  Administered 2012-06-08 – 2012-06-09 (×4): 25 mg via ORAL
  Filled 2012-06-08 (×4): qty 1

## 2012-06-08 MED ORDER — TRAZODONE HCL 50 MG PO TABS
ORAL_TABLET | ORAL | Status: AC
  Filled 2012-06-08: qty 1

## 2012-06-08 NOTE — Progress Notes (Addendum)
[facility] Adult Inpatient Family/Significant Other Suicide Prevention Education  Suicide Prevention Education:  Education Completed; Alicia Golden, Mother, at ([phone] has been identified by the patient as the family member/significant other with whom the patient will be residing, and identified as the person(s) who will aid the patient in the event of a mental health crisis (suicidal ideations/suicide attempt).  With written consent from the patient, the family member/significant other has been provided the following suicide prevention education, prior to the and/or following the discharge of the patient.  The suicide prevention education provided includes the following:  Suicide risk factors  Suicide prevention and interventions  National Suicide Hotline telephone number  [facility] assessment telephone number  [facility] Emergency Assistance 911  [facility] and/or Residential Mobile Crisis Unit telephone number (Ms. Reimann had noting to write with yet will call writer back for number)  Request made of family/significant other to:  Remove weapons (e.g., guns, rifles, knives), all items previously/currently identified as safety concern.    Remove drugs/medications (over-the-counter, prescriptions, illicit drugs), all items previously/currently identified as a safety concern.  The family member/significant other verbalizes understanding of the suicide prevention education information provided.  The family member/significant other agrees to remove the items of safety concern listed above.  Alicia Golden 06/08/2012, 6:12 PM

## 2012-06-08 NOTE — ED Provider Notes (Signed)
History/physical exam/procedure(s) were performed by non-physician practitioner and as supervising physician I was immediately available for consultation/collaboration. I have reviewed all notes and am in agreement with care and plan.   Makiah Foye S Lenice Koper, MD 06/08/12 1744 

## 2012-06-08 NOTE — Progress Notes (Signed)
[facility] Group Notes:  (Counselor/Nursing/MHT/Case Management/Adjunct)  06/08/2012 2:14 PM  Type of Therapy:  Group Therapy at 1:15 to 2:30 PM  Participation Level:  Did Not Attend   [name] 06/08/2012, 2:14 PM

## 2012-06-08 NOTE — Progress Notes (Signed)
Patient ID: Alicia Golden, female   DOB: [birth year], 31 y.o.   MRN: [id number]  Pt angry, agitated and cursing, demanding to be placed on Suboxone immediately. Pt informed that Suboxone would not be given here. Pt then demanding to be given sleep medication. Dr. Baron Sane notified; new orders received.

## 2012-06-08 NOTE — Progress Notes (Signed)
[facility] Group Notes:  (Counselor/Nursing/MHT/Case Management/Adjunct)  06/08/2012 1:42 AM  Type of Therapy:  AA Meeting  Participation Level:  Did Not Attend  Participation Quality:  Did not attend  Affect:  Appropriate  Cognitive:  Appropriate  Insight:  Did not attend  Engagement in Group:  Did not attend  Engagement in Therapy:  Did not attend  Modes of Intervention:  Problem-solving and Support  Summary of Progress/Problems:   Alicia Golden 06/08/2012, 1:42 AMThe focus of this group is to help patients review their daily goal of treatment and discuss progress on daily workbooks.

## 2012-06-08 NOTE — Progress Notes (Signed)
06/08/2012         Time: 1500      Group Topic/Focus: The focus of this group is on enhancing the patient's understanding of leisure, barriers to leisure, and the importance of engaging in positive leisure activities upon discharge for improved total health.  Participation Level: Minimal  Participation Quality: Resistant  Affect: Irritable  Cognitive: Oriented   Additional Comments: Patient agitated, overheard making statements like "this is dumb" and "who cares" during group. Patient walked out a few minutes early and didn't return.   Dylana Shaw 06/08/2012 3:51 PM

## 2012-06-08 NOTE — Tx Team (Signed)
Interdisciplinary Treatment Plan Update (Adult)  Date:  06/08/2012  Time Reviewed:  10:23 AM   Progress in Treatment: Attending groups: Yes Participating in groups:  Yes Taking medication as prescribed: Yes Tolerating medication:  Yes Family/Significant othe contact made:  Counselor assessing for appropriate contact Patient understands diagnosis:  Yes Discussing patient identified problems/goals with staff:  Yes Medical problems stabilized or resolved:  Yes Denies suicidal/homicidal ideation: Yes Issues/concerns per patient self-inventory:  None identified Other: N/A  New problem(s) identified: None Identified  Reason for Continuation of Hospitalization: Anxiety Depression Medication stabilization Withdrawal symptoms  Interventions implemented related to continuation of hospitalization: mood stabilization, medication monitoring and adjustment, group therapy and psycho education, safety checks q 15 mins  Additional comments: N/A  Estimated length of stay: 3-5 days  Discharge Plan: SW will assess for appropriate referrals.   New goal(s): N/A  Review of initial/current patient goals per problem list:    1.  Goal(s): Address substance use  Met:  No  Target date: by discharge  As evidenced by: completing detox protocol and refer to appropriate treatment  2.  Goal (s): Reduce depressive and anxiety symptoms  Met:  No  Target date: by discharge  As evidenced by: Reducing depression from a 10 to a 3 as reported by pt.     Attendees: Patient:  Alicia Golden 06/08/2012 10:23 AM   Family:     Physician:  Lupe Carney, DO 06/08/2012 10:23 AM   Nursing: Quintella Reichert, RN 06/08/2012 10:23 AM   Case Manager:  Reyes Ivan, LCSWA 06/08/2012  10:23 AM   Counselor:  Ronda Fairly, LCSWA 06/08/2012  10:23 AM   Other:  Richelle Ito, LCSW 06/08/2012 10:23 AM   Other:  Roswell Miners, RN 06/08/2012 10:23 AM   Other:  Abbie Sons, RN 06/08/2012 10:23 AM   Other:       Scribe for Treatment Team:   Reyes Ivan 06/08/2012 10:23 AM

## 2012-06-08 NOTE — Progress Notes (Signed)
Patient ID: Alicia Golden, female   DOB: 23-Nov-1980, 31 y.o.   MRN: [id number]  Problem: Poly substance Abuse  D: Pt agitated and verbally aggressive towards staff. Pt anxious, and pacing unit.   A: Monitor patient Q 15 minutes for safety, encourage staff/peer interaction and group participation. Reassure patient and administer medications as ordered by MD.   R: Pt compliant with medications, difficult to redirect at times, pt did not attend group session. Pt calmed down after Librium given as ordered. Will continue to monitor patient. Pt to quiet room per her own request to allow for sleep. No distress noted.

## 2012-06-08 NOTE — Progress Notes (Signed)
Psychoeducational Group Note  Date:  06/08/2012 Time: 1100  Group Topic/Focus:  Overcoming Stress:   The focus of this group is to define stress and help patients assess their triggers.  Participation Level: Did Not Attend  Participation Quality:  Not Applicable  Affect:  Not Applicable  Cognitive:  Not Applicable  Insight:  Not Applicable  Engagement in Group: Not Applicable  Additional Comments:  Pt remained in bed resting in bed.   Sharyn Lull 06/08/2012, 12:30 PM

## 2012-06-08 NOTE — Progress Notes (Signed)
Patient ID: Alicia Golden, female   DOB: May 18, 1981, 31 y.o.   MRN: [id number]   D: Patient pleasant on approach this am. States feeling really bad due to withdrawal symptoms. Reports some diarrhea, stomach cramps, generalized aching, stuffy nose, agitation, anxiety, and cravings. Reports court hearing for drug paraphernalia in September. Reports probation officer recommended her to go through a 28 day treatment program. Currently denies depression and SI. States she does have a history of bipolar. Did report father passed away 1 year ago but doesn't want grief counseling at this time. Wants to focus on sobriety to get her children back.  A: Staff will monitor on q 15 minute checks and encourage group attendance. RN to give medications and administer treatments as ordered. R: Patient wanting more meds and will speak to physician about med regimen. Will continue on the clonidine protocol as ordered.

## 2012-06-08 NOTE — [facility] Suicide Risk Assessment (Signed)
Suicide Risk Assessment  Admission Assessment     Demographic factors:  See chart.  Current Mental Status:  Patient seen and evaluated. Chart reviewed. Patient stated that her mood was "not good". Her affect was mood congruent and anxious with sig w/d s/s noted. She denied any current thoughts of self injurious behavior, suicidal ideation or homicidal ideation. There were no auditory or visual hallucinations, paranoia, delusional thought processes, or mania noted.  Thought process was linear and goal directed.  No psychomotor agitation or retardation was noted. Speech was normal rate, tone and volume. Eye contact was good. Judgment and insight are fair.  Patient has been up and engaged on the unit.  No acute safety concerns reported from team.  Loss Factors: Decrease in vocational status;Legal issues  Historical Factors: FamHx BPAD; denied FamHx suicide; diagnosed with BPAD 6 yrs ago; suicide attempt 1 yr ago    Risk Reduction Factors: Living with another person, especially a relative; interested in 28 day program  CLINICAL FACTORS: Opioid Use & W/D Disorder; Alcohol Use & W/D Disorder; Chronic Pain; Benzodiazepine W/D Disorder; BPAD, per Hx  COGNITIVE FEATURES THAT CONTRIBUTE TO RISK: none.  SUICIDE RISK: Pt viewed as a chronic increased risk of harm to self in light of her past hx and risk factors.  No acute safety concerns on the unit.  Pt contracting for safety and in need of crisis stabilization, detox & Tx.  PLAN OF CARE: Restarted gabapentin for pain with upward titration planned towards outpt dose of 400mg  tid; Lithium restarted at 300mg  bid for mood stabilization as in past; EKG ordered for baseline review; librium taper added for w/d off alcohol and Xanax. Pt admitted for crisis stabilization and treatment.  Please see orders.   Medications reviewed with pt and medication education provided.  Pt also seen and evaluated by physician extender, please see H&P.  Will continue q15 minute  checks per unit protocol.  No clinical indication for one on one level of observation at this time.  Pt contracting for safety.  Mental health treatment, medication management and continued sobriety will mitigate against the increased risk of harm to self and/or others.  Discussed the importance of recovery with pt, as well as, tools to move forward in a healthy & safe manner.  Pt agreeable with the plan.  Discussed with the team.    Alicia Golden 06/08/2012, 1:16 PM

## 2012-06-08 NOTE — [facility] Counselor (Signed)
Adult Comprehensive Assessment  Patient ID: Alicia Golden, female   DOB: 1981/05/30, 31 y.o.   MRN: [id number]  Information Source: Information source: Patient  Current Stressors:  Educational / Learning stressors: 8th grade education Employment / Job issues: Unemployed Family Relationships: Strained due to patient's Arboriculturist / Lack of resources (include bankruptcy): "tight" Housing / Lack of housing: NA Physical health (include injuries & life threatening diseases): Noncompliance with psyche meds; broken teeth Social relationships: Strained Substance abuse: History and current relapse Bereavement / Loss: Father 08/2011 of cancer; Best friend electrocuted in [place] 3 years ago; step father committed suicide when patient was 62 after which she quit school  Living/Environment/Situation:  Living Arrangements: Parent Living conditions (as described by patient or guardian): Supportive, okay How long has patient lived in current situation?: 2 years What is atmosphere in current home: Supportive  Family History:  Marital status: Single Does patient have children?: Yes How many children?: 3  How is patient's relationship with their children?: Good; all live with their father  Childhood History:  By whom was/is the patient raised?: Both parents Additional childhood history information: Both parents alcoholics Description of patient's relationship with caregiver when they were a child: Good Patient's description of current relationship with people who raised him/her: Good Does patient have siblings?: Yes Number of Siblings: 2  Description of patient's current relationship with siblings: Sister and brother, both alcoholic Did patient suffer any verbal/emotional/physical/sexual abuse as a child?: No Did patient suffer from severe childhood neglect?: No Has patient ever been sexually abused/assaulted/raped as an adolescent or adult?: No Was the patient ever a victim of a  crime or a disaster?: No Witnessed domestic violence?: No Has patient been effected by domestic violence as an adult?: No  Education:  Highest grade of school patient has completed: 8th Currently a student?: No  Employment/Work Situation:   Employment situation: Unemployed What is the longest time patient has a held a job?: 6.5 years 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 Has patient ever been in the [employer]?: No Has patient ever served in Buyer, retail?: No  Financial Resources:   Surveyor, quantity resources: Sales executive;No income Does patient have a representative payee or guardian?: No  Alcohol/Substance Abuse:   What has been your use of drugs/alcohol within the last 12 months?: Heroin  1-2 grams daily for 10 months; Benzos (Xanax) 2+ daily when available for 1 year; Ethol  If attempted suicide, did drugs/alcohol play a role in this?:  (No attempt) Alcohol/Substance Abuse Treatment Hx: Past detox If yes, describe treatment: August 2012 [facility] Has alcohol/substance abuse ever caused legal problems?: Yes (06/19/12/ Court date for [employer] and selling Rx meds)  Social Support System:   Patient's Community Support System: Fair Describe Community Support System: Family Type of faith/religion: NA How does patient's faith help to cope with current illness?: NA  Leisure/Recreation:   Leisure and Hobbies: Dance  Strengths/Needs:   What things does the patient do well?: Dance, Good Mom In what areas does patient struggle / problems for patient: Staying clean  Discharge Plan:   Does patient have access to transportation?: Yes Will patient be returning to same living situation after discharge?: Yes Currently receiving community mental health services: No (Recently discharged from [facility]) If no, would patient like referral for services when discharged?: Yes (What county[place] Medical sales [place]) Does patient have financial barriers related to discharge  medications?: Yes Patient description of barriers related to discharge medications: No income  Summary/Recommendations:  Summary and Recommendations (to be completed by the evaluator): Patient is 31 YO single unemployed caucasian female admitted with diagnosis of Polysubstance Dependence.  Patient will benefit from crisis stabilization, medication evaluation, group therapy and psycho education in addition to discharge planning.   [name]. 06/08/2012

## 2012-06-08 NOTE — Progress Notes (Signed)
Patient ID: Alicia Golden, female   DOB: [birth year], 32 y.o.   MRN: [id number] Pt awake, stating she is feeling anxious and cannot sleep. Pt given Robaxin, Librium, and Vistaril PRN as ordered. Pt took medicine and states "That's it? So I guess I/m gonna have to lay awake all night". Pt agitated and returned to quiet room.

## 2012-06-08 NOTE — Progress Notes (Signed)
Pt attended discharge planning group and actively participated in group.  SW provided pt with today's workbook.  Pt presents with agitated affect and mood.  Pt states that she is having a hard time detoxing today.  Pt states that she is coming off of alcohol and heroin.  Pt states that she was on methadone but was dropped from the clinic because she kept testing positive for heroin and alcohol.  Pt states that she is tired of using and wants her kids back.  Pt states that DSS is not involved and their father has them currently.  Pt states that she wants a 28 day program and her probation officer recommends rehab as well.  SW will assess for appropriate referrals.  Pt states that she lives in [place] with her mom and has transportation home.  No further needs voiced by pt at this time.    Reyes Ivan, LCSWA 06/08/2012  10:33 AM

## 2012-06-09 MED ORDER — OLANZAPINE 5 MG PO TBDP
5.0000 mg | ORAL_TABLET | Freq: Once | ORAL | Status: AC
  Administered 2012-06-09: 5 mg via ORAL
  Filled 2012-06-09 (×2): qty 1

## 2012-06-09 MED ORDER — PROMETHAZINE HCL 25 MG RE SUPP
25.0000 mg | Freq: Three times a day (TID) | RECTAL | Status: DC | PRN
  Administered 2012-06-09: 25 mg via RECTAL
  Filled 2012-06-09: qty 1

## 2012-06-09 MED ORDER — BENZOCAINE 10 % MT GEL
Freq: Four times a day (QID) | OROMUCOSAL | Status: DC | PRN
  Administered 2012-06-09 – 2012-06-12 (×3): via OROMUCOSAL
  Filled 2012-06-09: qty 9.4

## 2012-06-09 MED ORDER — ONDANSETRON 4 MG PO TBDP
8.0000 mg | ORAL_TABLET | Freq: Three times a day (TID) | ORAL | Status: DC | PRN
  Administered 2012-06-09: 8 mg via ORAL
  Filled 2012-06-09: qty 2

## 2012-06-09 MED ORDER — PROCHLORPERAZINE EDISYLATE 5 MG/ML IJ SOLN
10.0000 mg | Freq: Once | INTRAMUSCULAR | Status: AC
  Administered 2012-06-09: 10 mg via INTRAMUSCULAR
  Filled 2012-06-09 (×2): qty 2

## 2012-06-09 MED ORDER — GABAPENTIN 300 MG PO CAPS
300.0000 mg | ORAL_CAPSULE | Freq: Three times a day (TID) | ORAL | Status: DC
  Administered 2012-06-09 – 2012-06-12 (×10): 300 mg via ORAL
  Filled 2012-06-09 (×13): qty 1

## 2012-06-09 MED ORDER — QUETIAPINE FUMARATE 25 MG PO TABS
25.0000 mg | ORAL_TABLET | Freq: Two times a day (BID) | ORAL | Status: DC
  Administered 2012-06-09 – 2012-06-10 (×2): 25 mg via ORAL
  Filled 2012-06-09 (×9): qty 1

## 2012-06-09 MED ORDER — TRAZODONE HCL 100 MG PO TABS
100.0000 mg | ORAL_TABLET | Freq: Every evening | ORAL | Status: DC | PRN
  Administered 2012-06-09 (×2): 100 mg via ORAL
  Filled 2012-06-09 (×7): qty 1

## 2012-06-09 NOTE — Progress Notes (Signed)
Psychoeducational Group Note  Date:  06/09/2012 Time:  1000  Group Topic/Focus:  Relapse Prevention Planning:   The focus of this group is to define relapse and discuss the need for planning to combat relapse.  Participation Level:  Active  Participation Quality:  Appropriate, Attentive, Sharing and Supportive  Affect:  Appropriate  Cognitive:  Alert, Appropriate and Oriented  Insight:  Good  Engagement in Group:  Good  Additional Comments:  Pt attended Psychoeducational activity. Pt was bright and appropriate during the activity and was joking with her peers.  Orma Render 06/09/2012, 1:06 PM

## 2012-06-09 NOTE — Progress Notes (Signed)
7a-7p nsg note D-Patient complains of multiple detox symptoms today. Reports tremors, diarrhea, chilling, cravings and agitation. Patient observed being verbally demanding on the unit. She related becoming more upset after finding out boyfriend has hepatitis. Patient stated "I am so scared I will get that." A-Patient spoke to her treatment team this morning. Several medication adjusts were made by MD. Zofran dosage was increased, neurontin was increased and zyprexa was started. Patient has been receptive to emotional support provided by this Clinical research associate. Patient reports that this is her first serious detox. She stated "I will stay away from heroin and ETOH forever after going through this agony."  R-Patient going down for meals. Encouraged to stay optimistic about the future and getting through the detox process.

## 2012-06-09 NOTE — Progress Notes (Signed)
[facility] Group Notes:  (Counselor/Nursing/MHT/Case Management/Adjunct)  06/09/2012 4:39 PM  Type of Therapy:  Group Therapy at 1:15 to 2:30  Participation Level:  Active  Participation Quality:  Intrusive and Inattentive  Affect:  Angry, Anxious and Irritable  Cognitive:  Oriented  Insight:  Poor  Engagement in Group:  Good  Engagement in Therapy:  None  Modes of Intervention:  Clarification, Limit-setting and Support  Summary of Progress/Problems: Group session included an educational portion on Post Acute Withdrawal Syndrome (PAWS) and a processing portion on feelings about current treatment.  Alicia Golden remains greatly distressed that she is not receiving the detox protocol she would like.  She was also upset with remarks made by medical staff in response to her request for pain medication and length of groups.    [name]  06/09/2012 4:43 PM

## 2012-06-09 NOTE — Progress Notes (Signed)
Patient ID: Alicia Golden, female   DOB: [birth year], 31 y.o.   MRN: [id number]  Problem: Opioid Dependence, ETOH abuse, Heroine   D: Pt with multiple somatic complaints, med-seeking behaviors, needy and irritable at times.  A: Monitor Q 15 minutes for safety, encourage staff/peer interaction, administer medications as ordered by MD.  R: Pt continues to have somatic complaints, requesting medications often. Pt sleeping in quiet room again tonight due to c/o snoring roommate. Will continue to monitor patient.

## 2012-06-09 NOTE — Progress Notes (Signed)
[facility] MD Progress Note  06/09/2012 11:55 AM   Current Mental Status: Patient seen and evaluated. Chart reviewed. Patient stated that her mood was "not good". Her affect was mood congruent, irritable and anxious with sig w/d s/s noted. Sig cravings noted with thoughts about leaving to use. She denied any current thoughts of self injurious behavior, suicidal ideation or homicidal ideation. There were no auditory or visual hallucinations, paranoia, delusional thought processes, or mania noted. Thought process was linear and goal directed. Sig psychomotor agitation noted. Speech was normal rate, tone and volume. Eye contact was good. Judgment and insight are limited. Patient has been up and engaged on the unit. No acute safety concerns reported from team.   Sleep:  Number of Hours: 3.25    Vital Signs:Blood pressure 125/85, pulse 55, temperature 98 F (36.7 C), temperature source Oral, resp. rate 18, height 4\' 8"  (1.422 m), weight 54.432 kg (120 lb), last menstrual period 05/15/2012.  Lab Results: No results found for this or any previous visit (from the past 48 hour(s)).  Physical Findings: AIMS: Facial and Oral Movements Muscles of Facial Expression: None, normal Lips and Perioral Area: None, normal Jaw: None, normal Tongue: None, normal,Extremity Movements Upper (arms, wrists, hands, fingers): None, normal Lower (legs, knees, ankles, toes): None, normal, Trunk Movements Neck, shoulders, hips: None, normal, Overall Severity Severity of abnormal movements (highest score from questions above): None, normal Incapacitation due to abnormal movements: None, normal Patient's awareness of abnormal movements (rate only patient's report): No Awareness, Dental Status Current problems with teeth and/or dentures?: No Does patient usually wear dentures?: No  CIWA:  CIWA-Ar Total: 7  COWS:  COWS Total Score: 0   CLINICAL FACTORS: Opioid Use & W/D Disorder; Alcohol Use & W/D Disorder; Chronic Pain;  Benzodiazepine W/D Disorder; BPAD, per Hx; Insomnia  Plan: 1. Increase Zofran to 8mg  q8hrs prn for N/V associated with w/d. 2. Increased Trazodone to 100mg  for sleep. 3. Added Zyprexa 5mg  once for acute agitation. 4. Will start Seroquel 25mg  bid for long term Tx of mood stability and anxiety at 5pm. 5. Increase gabapentin to 300mg  tid to titrate upwards to outpt dose of 400mg  tid.  Medication education completed.  Pros, cons, risks, potential side effects and benefits were discussed with pt.  Pt agreeable with the plan.  See orders.  Discussed with team.   Lupe Carney 06/09/2012, 11:55 AM

## 2012-06-09 NOTE — Progress Notes (Signed)
Patient reported to Clinical research associate that she had vomited. Has been requesting phenergan injections for vomiting. Observed patient sitting by toilet. Moderate amount of greenish-yellow material in the toilet. The patient asked writer "Why is it so green.? Was given ginger ale.

## 2012-06-09 NOTE — Progress Notes (Signed)
Psychoeducational Group Note  Date:  06/09/2012 Time:  1100  Group Topic/Focus:  Relapse Prevention Planning:   The focus of this group is to define relapse and discuss the need for planning to combat relapse.  Participation Level:  Minimal  Participation Quality:  Intrusive, Inattentive and Monopolizing  Affect:  Blunted, Depressed and Flat  Cognitive:  Inappropriate  Insight:  Limited  Engagement in Group:  Limited  Additional Comments:   Pt attended group but participation was limited. Group discussion continued to focus on wants and demands from [facility] even when this Clinical research associate redirected conversation.    Dalia Heading 06/09/2012, 4:43 PM

## 2012-06-09 NOTE — Progress Notes (Signed)
Pt did not attend d/c planning group on this date.  SW met with pt individually at this time.  Pt was resting in bed.  Pt's bed was on the floor.  Pt states that this is more comfortable for her.  Pt states that she didn't sleep at all last night and instead was up vomiting all night.  Pt states that she doesn't think it is withdrawal.  Pt wants long term treatment and SW encouraged pt to go to [facility].  SW secured pt a bed for treatment at [facility] for Tuesday.  Pt states that she will talk to her mom today about this as an option.  Pt denies having depression or SI and rates anxiety at a 10 today.  No further needs voiced by pt at this time.    Reyes Ivan, LCSWA 06/09/2012  9:12 AM

## 2012-06-09 NOTE — Progress Notes (Signed)
Patient did not attend Karaoke group this evening.  

## 2012-06-10 MED ORDER — QUETIAPINE FUMARATE 50 MG PO TABS
50.0000 mg | ORAL_TABLET | Freq: Two times a day (BID) | ORAL | Status: DC
  Administered 2012-06-10 – 2012-06-12 (×4): 50 mg via ORAL
  Filled 2012-06-10 (×6): qty 1

## 2012-06-10 MED ORDER — TRAZODONE HCL 150 MG PO TABS
150.0000 mg | ORAL_TABLET | Freq: Every evening | ORAL | Status: DC | PRN
  Administered 2012-06-10 – 2012-06-11 (×4): 150 mg via ORAL
  Filled 2012-06-10 (×4): qty 1
  Filled 2012-06-10: qty 3
  Filled 2012-06-10 (×2): qty 1

## 2012-06-10 MED ORDER — NICOTINE 21 MG/24HR TD PT24: 21.0000 mg | MEDICATED_PATCH | Freq: Every day | TRANSDERMAL | Status: DC

## 2012-06-10 NOTE — Progress Notes (Signed)
Patient ID: Alicia Golden, female   DOB: 1981-08-14, 31 y.o.   MRN: [id number] [facility] MD Progress Note  06/10/2012 12:21 PM   Patient seen and evaluated. Chart reviewed. Patient stated that her mood was "not good". Reports poor sleep and more anxiety now. Reports withdrawal symptoms now like sneezing per pt.   Current Mental Status: Her affect was mood congruent, irritable and anxious with sig w/d s/s noted.  She denied any current thoughts of self injurious behavior, suicidal ideation or homicidal ideation. There were no auditory or visual hallucinations, paranoia, delusional thought processes, or mania noted. Thought process was linear and goal directed. Sig psychomotor agitation noted. Speech was normal rate, tone and volume. Eye contact was good. Judgment and insight are limited. Patient has been up and engaged on the unit. No acute safety concerns reported from team.   Sleep:  Number of Hours: 3.75    Vital Signs:Blood pressure 130/84, pulse 95, temperature 97.6 F (36.4 C), temperature source Oral, resp. rate 16, height 4\' 8"  (1.422 m), weight 54.432 kg (120 lb), last menstrual period 05/15/2012.  Lab Results: No results found for this or any previous visit (from the past 48 hour(s)).  Physical Findings: AIMS: Facial and Oral Movements Muscles of Facial Expression: None, normal Lips and Perioral Area: None, normal Jaw: None, normal Tongue: None, normal,Extremity Movements Upper (arms, wrists, hands, fingers): None, normal Lower (legs, knees, ankles, toes): None, normal, Trunk Movements Neck, shoulders, hips: None, normal, Overall Severity Severity of abnormal movements (highest score from questions above): None, normal Incapacitation due to abnormal movements: None, normal Patient's awareness of abnormal movements (rate only patient's report): No Awareness, Dental Status Current problems with teeth and/or dentures?: No Does patient usually wear dentures?: No  CIWA:  CIWA-Ar Total: 6    COWS:  COWS Total Score: 3   CLINICAL FACTORS: Opioid Use & W/D Disorder; Alcohol Use & W/D Disorder; Chronic Pain; Benzodiazepine W/D Disorder; BPAD, per Hx; Insomnia  Plan:  Will increase trazodone to 150 mg qhs for sleep Will increase seroquel for anxiety and mood to 50 mg qhs   Wonda Cerise 06/10/2012, 12:21 PM

## 2012-06-10 NOTE — Progress Notes (Signed)
Patient did attend this evenings speaker AA meeting. 

## 2012-06-10 NOTE — Progress Notes (Signed)
D:  Patient up to some of the groups today, but was not engaged.  Has had frequent requests for medications today.  Is upset that she has not gotten a good nights sleep.  Became angry when I told her what her medications were.  She is asking for something stronger.   A:  I advised patient that the medications she has ordered for tonight would likely help her to relax.  I encouraged her to stay up all day so that she would feel more tired this evening when she went to lie down.  She was educated about the medications and what they were used for.   R:  She is frustrated but finally agreed to take what was being offered this evening.

## 2012-06-10 NOTE — Progress Notes (Signed)
[facility] Group Notes:  (Counselor/Nursing/MHT/Case Management/Adjunct)  06/10/2012 7:01 PM  Type of Therapy:  Psychoeducational Skills  Participation Level:  Minimal  Participation Quality:  Inattentive and Redirectable  Affect:  Angry and Labile  Cognitive:  Appropriate  Insight:  Limited  Engagement in Group:  Limited  Engagement in Therapy:  Limited  Modes of Intervention:  Activity, Education and Socialization  Summary of Progress/Problems:Group played coping skills Pictionary, discussing the benefits of coping skills and how they are used outside of the treatment facility.     Dalia Heading 06/10/2012, 7:01 PM

## 2012-06-10 NOTE — Progress Notes (Signed)
D: Pt in bed resting with eyes closed. Respirations even and unlabored. Pt appears to be in no signs of distress at this time. A: Q15min checks remains for this pt. R: Pt remains safe at this time.   

## 2012-06-10 NOTE — Progress Notes (Signed)
[facility] Group Notes:  (Counselor/Nursing/MHT/Case Management/Adjunct)  06/10/2012 5:35 PM  Type of Therapy:  Group Therapy  Participation Level:  Minimal  Participation Quality:  Appropriate, Drowsy and pt participated when she was alert but stated that she didn't feel well  Affect:  Anxious, Depressed and Irritable  Cognitive:  Appropriate  Insight:  Limited  Engagement in Group:  Limited  Engagement in Therapy:  Limited  Modes of Intervention:  Activity, Problem-solving, Support and development of coping skills and addressing self-sabatoging behaviors.   Summary of Progress/Problems: Pt was present during group but was drowsy and not feeling well. She did not participate fully in group due to this. Pt was able to discuss positive coping skills and identified going to meetings as being one way for her to stay on track in recovery. Pt stated that she needed to change her lifestyle and friends so that she could do it as well. Pt is invested in going to treatment but is concerned that she isn't going to to the "right" treatment facility for her. Pt was able to identify what would be a struggle for her and was able to process what she would do to address these concerns in treatment.   Berlin Hun, MSW, LCSW 06/10/2012, 5:35 PM

## 2012-06-10 NOTE — Progress Notes (Signed)
Psychoeducational Group Note  Date:  06/09/2012 Time:  2000  Group Topic/Focus:  AA  Participation Level:  None  Participation Quality:  None  Affect:  Flat  Cognitive:  Alert  Insight:  None  Engagement in Group:  None  Additional Comments:  Pt slept most of the hour while group was going on.  Tymeka Privette R 06/10/2012, 12:00 AM

## 2012-06-10 NOTE — Progress Notes (Signed)
[facility] Group Notes:  (Counselor/Nursing/MHT/Case Management/Adjunct)  06/10/2012 4:51 PM  Type of Therapy:  Group Therapy  Participation Level:  Active  Participation Quality:  Drowsy and Sharing  Affect:  Anxious and Depressed  Cognitive:  Appropriate and Oriented  Insight:  Good  Engagement in Group:  Limited  Engagement in Therapy:  Limited  Modes of Intervention:  Education, Geophysicist/field seismologist of Progress/Problems: Pt was drowsy during group and had difficulty staying awake stating that she didn't feel well due to detoxing off drugs. Pt participated in aftercare and suicide prevention discussion.  Pt is invested in going to Rehab but is concerned about going to [facility] because she isn't good at following rules. She is concerned that she is going to get kicked out of Rehab. She stated that [facility]. Pt was appropriate in group when she was alert and able to interact appropriately.  Berlin Hun 06/10/2012, 4:51 PM

## 2012-06-11 LAB — URINALYSIS, ROUTINE W REFLEX MICROSCOPIC
Glucose, UA: NEGATIVE mg/dL
Leukocytes, UA: NEGATIVE
Nitrite: NEGATIVE
Specific Gravity, Urine: 1.017 (ref 1.005–1.030)
pH: 7 (ref 5.0–8.0)

## 2012-06-11 MED ORDER — HYDROXYZINE HCL 25 MG PO TABS
25.0000 mg | ORAL_TABLET | Freq: Three times a day (TID) | ORAL | Status: DC | PRN
  Administered 2012-06-11 – 2012-06-12 (×5): 25 mg via ORAL

## 2012-06-11 MED ORDER — VENLAFAXINE HCL ER 37.5 MG PO CP24
37.5000 mg | ORAL_CAPSULE | Freq: Every day | ORAL | Status: DC
  Administered 2012-06-11 – 2012-06-12 (×2): 37.5 mg via ORAL
  Filled 2012-06-11 (×3): qty 1

## 2012-06-11 NOTE — Progress Notes (Signed)
D: At medication window requesting HS meds on approach. Appears flat and depressed. Calm and cooperative with assessment. No acute distress noted. States she feels like she is doing better overall, but c/o continued low energy. Did request a prn for anxiety. Otherwise offered no questions or concerns. Denies SI/HI/AVH and contracts for safety.  A: Safety has been maintained with Q15 minute observation. Support and encouragement provided. Discussed  Detox process and how energy levels are affected. States she feels like she needs ensure. Encouraged her to discuss that request with MD tomorrow. Offered Gatorade and she accepted. Reviewed POC and medications and understanding verbalized. Meds given as ordered by MD.   R: Pt remains safe. She is mildly anxious but is no acute distress. Shee is complaint with her medications and treatment goals. She offers no additional questions or concerns. Will continue Q15 minute observation, f/u response to prn and continue current POC.

## 2012-06-11 NOTE — Progress Notes (Signed)
  D) Patient pleasant and cooperative upon my assessment. Patient states slept "well," and  appetite is "poor." Patient states "I just don't feel good today."  Patient denies SI/HI, denies A/V hallucinations.   A) Patient offered support and encouragement, patient encouraged to discuss feelings/concerns with staff. Patient verbalized understanding. Patient monitored Q15 minutes for safety. Reviewed medication orders and today's goals and plan of care, patient verbalizes understanding.   R) Patient active on unit, attending groups in day room and meals in dining room.  Patient taking medications as ordered. Will continue to monitor.

## 2012-06-11 NOTE — Progress Notes (Signed)
Patient ID: Alicia Golden, female   DOB: 14-May-1981, 31 y.o.   MRN: [id number]   [facility] Group Notes:  (Counselor/Nursing/MHT/Case Management/Adjunct)  06/11/2012 1:15 PM  Type of Therapy:  Group Therapy, Dance/Movement Therapy   Participation Level:  None  Participation Quality:  Drowsy  Affect:  Appropriate  Cognitive:  Appropriate  Insight:  None  Engagement in Group:  None  Engagement in Therapy:  None  Modes of Intervention:  Clarification, Problem-solving, Role-play, Socialization and Support  Summary of Progress/Problems:   Therapist began group by inviting group members to share where they saw themselves in 90 days. Therapist and group members discussed the difference between dependence and abuse and types of support systems we need for recovery. Pt was unable to wake and not responsive to redirection.   [name] 06/11/2012. 3:06 PM

## 2012-06-11 NOTE — Progress Notes (Signed)
Alicia Golden was heard in dayroom speaking to peer, stated "If you don;t shut your mouth I'm going to fuck your face up". Threat was reported to RN.

## 2012-06-11 NOTE — Progress Notes (Signed)
[facility] Group Notes:  (Counselor/Nursing/MHT/Case Management/Adjunct)  06/11/2012 7:54 PM  Type of Therapy:  Psychoeducational Skills  Participation Level:  Minimal  Participation Quality:  Intrusive, Inattentive and Resistant  Affect:  Irritable  Cognitive:  Alert, Appropriate and Oriented  Insight:  None  Engagement in Group:  None  Engagement in Therapy:  n/a  Modes of Intervention:  Clarification, Education, Limit-setting and Problem-solving  Summary of Progress/Problems: Tiyanna was late but did attend [facility] orientation group. Devony was talking out of turn, and cursing throughout group.    Wandra Scot 06/11/2012, 7:54 PM

## 2012-06-11 NOTE — Progress Notes (Signed)
Patient ID: Alicia Golden, female   DOB: [birth year], 31 y.o.   MRN: [id number] Patient ID: Alicia Golden, female   DOB: [birth year], 31 y.o.   MRN: [id number] [facility] MD Progress Note  06/11/2012 4:26 PM   Patient seen and evaluated. Chart reviewed. Patient stated that her mood is sad. Reports poor sleep and more anxiety now. Reports burnig urination. Concerned about STD now. Wants testing for all.   Current Mental Status: Her affect was mood congruent, irritable and anxious with sig w/d s/s noted.  She denied any current thoughts of self injurious behavior, suicidal ideation or homicidal ideation. There were no auditory or visual hallucinations, paranoia, delusional thought processes, or mania noted. Thought process was linear and goal directed. Sig psychomotor agitation noted. Speech was normal rate, tone and volume. Eye contact was good. Judgment and insight are limited. Patient has been up and engaged on the unit. No acute safety concerns reported from team.   Sleep:  Number of Hours: 6.75    Vital Signs:Blood pressure 110/80, pulse 89, temperature 97.5 F (36.4 C), temperature source Oral, resp. rate 24, height 4\' 8"  (1.422 m), weight 54.432 kg (120 lb), last menstrual period 05/15/2012.  Lab Results: No results found for this or any previous visit (from the past 48 hour(s)).  Physical Findings: AIMS: Facial and Oral Movements Muscles of Facial Expression: None, normal Lips and Perioral Area: None, normal Jaw: None, normal Tongue: None, normal,Extremity Movements Upper (arms, wrists, hands, fingers): None, normal Lower (legs, knees, ankles, toes): None, normal, Trunk Movements Neck, shoulders, hips: None, normal, Overall Severity Severity of abnormal movements (highest score from questions above): None, normal Incapacitation due to abnormal movements: None, normal Patient's awareness of abnormal movements (rate only patient's report): No Awareness, Dental Status Current problems with  teeth and/or dentures?: No Does patient usually wear dentures?: No  CIWA:  CIWA-Ar Total: 4  COWS:  COWS Total Score: 3   CLINICAL FACTORS: Opioid Use & W/D Disorder; Alcohol Use & W/D Disorder; Chronic Pain; Benzodiazepine W/D Disorder; BPAD, per Hx; Insomnia  Plan:  Will add vistaril prn for anxiety Will start effexor Xr 37.5 for anxiety and depression Will do std testing and UA for UTI. Primary to follow on results  Wonda Cerise 06/11/2012, 4:26 PM

## 2012-06-11 NOTE — Progress Notes (Signed)
Patient ID: Alicia Golden, female   DOB: 1981-09-06, 30 y.o.   MRN: [id number]  Pt. attended and participated in aftercare planning group. Pt. verbally accepted information on suicide prevention, warning signs to look for with suicide and crisis line numbers to use. The pt. agreed to call crisis line numbers if having warning signs or having thoughts of suicide. Pt. refused to share current depression and anxiety levels during group.

## 2012-06-12 LAB — LITHIUM LEVEL: Lithium Lvl: 0.46 mEq/L — ABNORMAL LOW (ref 0.80–1.40)

## 2012-06-12 LAB — HIV ANTIBODY (ROUTINE TESTING W REFLEX): HIV: NONREACTIVE

## 2012-06-12 LAB — HEPATITIS PANEL, ACUTE: HCV Ab: REACTIVE — AB

## 2012-06-12 MED ORDER — TRAZODONE HCL 100 MG PO TABS
200.0000 mg | ORAL_TABLET | Freq: Every day | ORAL | Status: DC
  Filled 2012-06-12: qty 2

## 2012-06-12 MED ORDER — QUETIAPINE FUMARATE 100 MG PO TABS
100.0000 mg | ORAL_TABLET | Freq: Three times a day (TID) | ORAL | Status: DC
  Administered 2012-06-12 – 2012-06-13 (×3): 100 mg via ORAL
  Filled 2012-06-12 (×5): qty 1

## 2012-06-12 MED ORDER — GABAPENTIN 300 MG PO CAPS
600.0000 mg | ORAL_CAPSULE | Freq: Three times a day (TID) | ORAL | Status: DC
  Administered 2012-06-12 – 2012-06-14 (×6): 600 mg via ORAL
  Filled 2012-06-12 (×11): qty 2

## 2012-06-12 MED ORDER — IBUPROFEN 800 MG PO TABS
800.0000 mg | ORAL_TABLET | Freq: Three times a day (TID) | ORAL | Status: DC
  Administered 2012-06-12 – 2012-06-14 (×5): 800 mg via ORAL
  Filled 2012-06-12 (×12): qty 1

## 2012-06-12 MED ORDER — TRAZODONE HCL 100 MG PO TABS
300.0000 mg | ORAL_TABLET | Freq: Once | ORAL | Status: AC
  Administered 2012-06-12: 300 mg via ORAL
  Filled 2012-06-12: qty 3

## 2012-06-12 MED ORDER — LITHIUM CARBONATE 300 MG PO CAPS
300.0000 mg | ORAL_CAPSULE | Freq: Three times a day (TID) | ORAL | Status: DC
  Administered 2012-06-12 – 2012-06-14 (×7): 300 mg via ORAL
  Filled 2012-06-12 (×11): qty 1

## 2012-06-12 MED ORDER — QUETIAPINE FUMARATE 100 MG PO TABS
100.0000 mg | ORAL_TABLET | Freq: Three times a day (TID) | ORAL | Status: DC
  Filled 2012-06-12: qty 1

## 2012-06-12 NOTE — Progress Notes (Signed)
Patient did attend the speaker AA meeting this evening and was attentive.  Pt had to be told to sit up as she was laying on another female pt during group.

## 2012-06-12 NOTE — Progress Notes (Signed)
[facility] MD Progress Note  06/12/2012 2:45 PM   Current Mental Status: Patient seen and evaluated. Chart reviewed. Patient stated that her mood was "not good". Continued anxiety, irritability and insomnia- yet, pt is much better than last Friday. Her affect was mood congruent, irritable and anxious with less w/d s/s noted. Sig cravings noted with thoughts about leaving to use. She denied any current thoughts of self injurious behavior, suicidal ideation or homicidal ideation. There were no auditory or visual hallucinations, paranoia, delusional thought processes, or mania noted. Thought process was linear and goal directed. Psychomotor agitation noted. Speech was normal rate, tone and volume. Eye contact was good. Judgment and insight are limited. Patient has been up and engaged on the unit. No acute safety concerns reported from team. Sill open to going to [facility], but will need to be pushed up in wk due to sig med changes today.   Sleep:  Number of Hours: 3    Vital Signs:Blood pressure 125/85, pulse 107, temperature 97.8 F (36.6 C), temperature source Oral, resp. rate 16, height 4\' 8"  (1.422 m), weight 54.432 kg (120 lb), last menstrual period 05/15/2012.  Lab Results:  Results for orders placed during the hospital encounter of 06/07/12 (from the past 48 hour(s))  URINALYSIS, ROUTINE W REFLEX MICROSCOPIC     Status: Normal   Collection Time   06/11/12 11:47 AM      Component Value Range Comment   Color, Urine YELLOW  YELLOW    APPearance CLEAR  CLEAR    Specific Gravity, Urine 1.017  1.005 - 1.030    pH 7.0  5.0 - 8.0    Glucose, UA NEGATIVE  NEGATIVE mg/dL    Hgb urine dipstick NEGATIVE  NEGATIVE    Bilirubin Urine NEGATIVE  NEGATIVE    Ketones, ur NEGATIVE  NEGATIVE mg/dL    Protein, ur NEGATIVE  NEGATIVE mg/dL    Urobilinogen, UA 0.2  0.0 - 1.0 mg/dL    Nitrite NEGATIVE  NEGATIVE    Leukocytes, UA NEGATIVE  NEGATIVE MICROSCOPIC NOT DONE ON URINES WITH NEGATIVE PROTEIN, BLOOD, LEUKOCYTES,  NITRITE, OR GLUCOSE <1000 mg/dL.  HEPATITIS PANEL, ACUTE     Status: Abnormal   Collection Time   06/11/12  7:25 PM      Component Value Range Comment   Hepatitis B Surface Ag NEGATIVE  NEGATIVE    HCV Ab Reactive (*) NEGATIVE    Hep A IgM NEGATIVE  NEGATIVE    Hep B C IgM NEGATIVE  NEGATIVE   HIV ANTIBODY (ROUTINE TESTING)     Status: Normal   Collection Time   06/11/12  7:25 PM      Component Value Range Comment   HIV NON REACTIVE  NON REACTIVE   LITHIUM LEVEL     Status: Abnormal   Collection Time   06/12/12  6:20 AM      Component Value Range Comment   Lithium Lvl 0.46 (*) 0.80 - 1.40 mEq/L     Physical Findings: AIMS: Facial and Oral Movements Muscles of Facial Expression: None, normal Lips and Perioral Area: None, normal Jaw: None, normal Tongue: None, normal,Extremity Movements Upper (arms, wrists, hands, fingers): None, normal Lower (legs, knees, ankles, toes): None, normal, Trunk Movements Neck, shoulders, hips: None, normal, Overall Severity Severity of abnormal movements (highest score from questions above): None, normal Incapacitation due to abnormal movements: None, normal Patient's awareness of abnormal movements (rate only patient's report): No Awareness, Dental Status Current problems with teeth and/or dentures?: No Does patient  usually wear dentures?: No  CIWA:  CIWA-Ar Total: 4  COWS:  COWS Total Score: 3   CLINICAL FACTORS: Opioid Use & W/D Disorder; Alcohol Use & W/D Disorder; Chronic Pain; Benzodiazepine W/D Disorder; BPAD; Insomnia  Plan: 1. Discontinued Effexor. 2. Increased Trazodone to 200mg  for sleep. 3. Increased Seroquel to 100mg  tid for long term Tx of mood stability and anxiety. 4. Increase gabapentin to 600mg  tid to titrate upwards to outpt dose of 800mg  tid. 5. Increased lithium to 300 tid for improved mood stability. 6. [facility] s/p acute stabilization. 7. Labs reviewed with pt, confirmatory HepC test ordered.  Medication education  completed.  Pros, cons, risks, potential side effects and benefits were discussed with pt.  Pt agreeable with the plan.  See orders.  Discussed with team.   Alicia Golden 06/12/2012, 2:45 PM

## 2012-06-12 NOTE — Progress Notes (Signed)
Late Entry: Patient attended lunch and sat with peers. Patient suddenly jumped up and yelled at peer in very close proximity "shut the fuck up I'm trying to tell you something real". Writer approached patient and peers, asked if everything was ok, patient and peer replied they were "fine" and began whispering to each other inaudible to Clinical research associate. Writer called for show of support, nursing staff came and situation was monitored for escalation. Patient became calm and friendly. Patient will continue to be observed for change in behavior.   Wandra Scot 2:05 PM 06/12/2012

## 2012-06-12 NOTE — Progress Notes (Signed)
Psychoeducational Group Note  Date:  06/12/2012 Time:  1100  Group Topic/Focus:  Relapse Prevention Planning:   The focus of this group is to define relapse and discuss the need for planning to combat relapse.  Participation Level:  Active  Participation Quality:  Appropriate, Sharing and Supportive  Affect:  Appropriate and Irritable  Cognitive:  Appropriate  Insight:  Good  Engagement in Group:  Good  Additional Comments:  None, pt came in from having treatment team, was a little upset.  Isla Pence M 06/12/2012, 12:19 PM

## 2012-06-12 NOTE — Progress Notes (Signed)
D) pt. Affect depressed and mood depressed as well.  Pt. Med seeking and c/o tooth pain "wanting to have them extracted at the ED tonight".  Attended groups and meals with other pts. Denied SI/HI and denied A/V hallucinations. A) pt. Offered pain medications as ordered.  Offered support and staff availability.  Medications reviewed frequently throughout the shift. Pt. Encouraged to speak with MD in am about medications as ordered.  R) Pt. Reports pain unchanged.  Pt. Expressed anger at medications "not being the way they were supposed to be".  Cont. Safe on q 15 min. Observations.

## 2012-06-12 NOTE — Progress Notes (Signed)
Psychoeducational Group Note  Date:  06/12/2012 Time:  1000  Group Topic/Focus:  Wellness Toolbox:   The focus of this group is to discuss various aspects of wellness, balancing those aspects and exploring ways to increase the ability to experience wellness.  Patients will create a wellness toolbox for use upon discharge.  Participation Level:  Active  Participation Quality:  Inattentive, Redirectable and Sharing  Affect:  Blunted, Depressed and Flat  Cognitive:  Alert and Appropriate  Insight:  Good  Engagement in Group:  Good  Additional Comments:  Initially pt was inattentive and silly but she became focused and participated.  Says that being clean and being with her  children is a priority to her.  Ralph Leyden 06/12/2012, 12:11 PM

## 2012-06-12 NOTE — Progress Notes (Addendum)
Pt attended discharge planning group and actively participated in group.  SW provided pt with today's workbook.  Pt presents with calm mood and affect.  Pt denies having depression and SI today but reports feeling very anxious, rating anxiety at an 8 today.  Pt states that she decided to go to [facility] tomorrow.  Pt states that her mom will be able to transport her there tomorrow morning.  Dr is making adjustments to pt's meds today and doesn't feel pt will be ready to go to [facility] tomorrow.  SW will see if a bed will be available later in the week for [facility].  Pt reports continuing to have craving, chills and agitation.  Pt reports feeling better from last week but reports poor sleep since admission.  No further needs voiced by pt at this time.  Safety planning and suicide prevention discussed.  Pt participated in discussion and acknowledged an understanding of the information provided.       Reyes Ivan, LCSWA 06/12/2012  10:27 AM     Bed available for pt at [facility] for pt on Thursday, 8/22.

## 2012-06-12 NOTE — Tx Team (Addendum)
Interdisciplinary Treatment Plan Update (Adult)  Date:  06/12/2012  Time Reviewed:  10:18 AM   Progress in Treatment: Attending groups: Yes Participating in groups:  Yes Taking medication as prescribed: Yes Tolerating medication:  Yes Family/Significant othe contact made:  Yes, contact made with mother Patient understands diagnosis:  Yes Discussing patient identified problems/goals with staff:  Yes Medical problems stabilized or resolved:  Yes Denies suicidal/homicidal ideation: Yes Issues/concerns per patient self-inventory:  None identified Other: N/A  New problem(s) identified: None Identified  Reason for Continuation of Hospitalization: Withdrawal Symptoms Medication Stabilization  Interventions implemented related to continuation of hospitalization: mood stabilization, medication monitoring and adjustment, group therapy and psycho education, safety checks q 15 mins   Additional comments: N/A   Estimated length of stay: 1-2 days  Discharge Plan: Pt is scheduled to go to [facility] tomorrow.  SW will call to reschedule admission due med changes being made today.    New goal(s): N/A  Review of initial/current patient goals per problem list:    1.  Goal(s): Address substance use  Met:  Yes  Target date: by discharge  As evidenced by: completed detox protocol and referred to appropriate treatment  2.  Goal (s): Reduce depressive and anxiety symptoms  Met:  No  Target date: by discharge  As evidenced by: Reducing depression from a 10 to a 3 as reported by pt.  Pt rates depression at a 0 and anxiety at an 8 today but reports feeling stable to d/c tomorrow.   3.  Goal(s): Eliminate SI  Met:  Yes  Target date: by discharge  As evidenced by: Pt denies SI.    Attendees: Patient:  Alicia Golden 06/12/2012 10:18 AM   Family:     Physician:  Lupe Carney, DO 06/12/2012 10:18 AM   Nursing: Roswell Miners, RN 06/12/2012 10:18 AM   Case Manager:   Reyes Ivan, LCSWA 06/12/2012 10:18 AM   Counselor:  Ronda Fairly, LCSWA 06/12/2012 10:18 AM   Other:  Richelle Ito, LCSW  06/12/2012 10:18 AM   Other:  Alease Frame, RN 06/12/2012 10:19 AM   Other:     Other:      Scribe for Treatment Team:   Reyes Ivan 06/12/2012 10:18 AM

## 2012-06-12 NOTE — Progress Notes (Signed)
[facility] Group Notes:  (Counselor/Nursing/MHT/Case Management/Adjunct)  06/12/2012 10:24 AM  Type of Therapy:  Group Therapy  Participation Level:  Minimal  Participation Quality:  Drowsy and Inattentive  Affect:  Flat when awake  Cognitive:  Oriented  Insight:  Limited  Engagement in Group:  Limited  Engagement in Therapy:  Limited  Modes of Intervention:  Clarification, Limit-setting, Socialization and Support  Summary of Progress/Problems:  Group discussion focused on what patient's see as their own obstacles to recovery.  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 Raechel went to sleep soon after the conversation shifted to other patients and remained so for 75% of group session.    [name] 06/12/2012, 3:21 PM

## 2012-06-13 MED ORDER — QUETIAPINE FUMARATE 100 MG PO TABS
100.0000 mg | ORAL_TABLET | Freq: Two times a day (BID) | ORAL | Status: DC
  Administered 2012-06-14: 100 mg via ORAL
  Filled 2012-06-13 (×5): qty 1

## 2012-06-13 MED ORDER — QUETIAPINE FUMARATE 300 MG PO TABS
300.0000 mg | ORAL_TABLET | Freq: Every day | ORAL | Status: DC
  Administered 2012-06-13 – 2012-06-14 (×2): 300 mg via ORAL
  Filled 2012-06-13 (×3): qty 1

## 2012-06-13 NOTE — Progress Notes (Signed)
Psychoeducational Group Note  Date:  06/13/2012 Time:  2000  Group Topic/Focus:  Wrap-Up Group:   The focus of this group is to help patients review their daily goal of treatment and discuss progress on daily workbooks.  Participation Level:  Active  Participation Quality:  Alert  Affect:    Cognitive:  Alert  Insight:  Alert  Engagement in Group:  Alert  Additional Comments:  Patient did attend AA meeting tonight.  Danner Paulding, Newton Pigg 06/13/2012, 9:55 PM

## 2012-06-13 NOTE — Progress Notes (Signed)
Patient complains of constipation this AM and states that she has not had a BM in 7 days.  Patient stated she lied to staff stating that she did have a BM so she could go home.  Milk of Mag given at [phone].

## 2012-06-13 NOTE — Progress Notes (Signed)
[facility] Group Notes:  (Counselor/Nursing/MHT/Case Management/Adjunct) Tuesday June 13, 2012 from 1:15 to 2:30    Type of Therapy:  Group Therapy   Participation Level:  Minimal  Participation Quality:  Sharing  Affect:  Appropriate  Cognitive:  Alert and Oriented  Insight:  Limited  Engagement in Group:  Limited  Engagement in Therapy:  Limited  Modes of Intervention:  Clarification and Support  Summary of Progress/Problems: Alicia Golden did not attend portion of group presentation by Interior and spatial designer of  [employer] ([employer]).  She did come in to group processing discussion afterward and shared that her "heart was breaking" as boyfriend of several years is breaking up with her.  "He worked all this elaborate plan out for [place] to end up at the same treatment center and now he dumps my ass."  Alicia Golden was open to considering other group members suggestion that she may be better off without him.    [name] 06/13/2012, 5:53 PM

## 2012-06-13 NOTE — Progress Notes (Signed)
06/13/2012         Time: 1500      Group Topic/Focus: The focus of this group is on enhancing patients' problem solving skills, which involves identifying the problem, brainstorming solutions and choosing and trying a solution.  Participation Level: Minimal  Participation Quality: Resistant  Affect: Blunted  Cognitive: Oriented   Additional Comments: Patient observed, but didn't participate in group.    Alicia Golden 06/13/2012 3:50 PM

## 2012-06-13 NOTE — Progress Notes (Signed)
Psychoeducational Group Note  Date:  06/13/2012 Time:  1100  Group Topic/Focus:  Recovery Goals:   The focus of this group is to identify appropriate goals for recovery and establish a plan to achieve them.  Participation Level:  Active  Participation Quality:  Appropriate and Attentive  Affect:  Appropriate  Cognitive:  Alert, Appropriate and Oriented  Insight:  Good  Engagement in Group:  Good  Additional Comments:  Pt. Listened attentively and filled out worksheet on recovery goals  Meredith Staggers 06/13/2012, 1:56 PM

## 2012-06-13 NOTE — Progress Notes (Deleted)
Psychoeducational Group Note  Date:  06/13/2012 Time:  2000  Group Topic/Focus:  Wrap-Up Group:   The focus of this group is to help patients review their daily goal of treatment and discuss progress on daily workbooks.  Participation Level:  Active  Participation Quality:  Appropriate  Affect:  Appropriate  Cognitive:  Appropriate  Insight:  Good  Engagement in Group:  Good  Additional Comments:  Patient attended AA meeting tonight.  Shandie Bertz N 06/13/2012, 9:02 PM 

## 2012-06-13 NOTE — Progress Notes (Signed)
Psychoeducational Group Note  Date:  06/13/2012 Time:  1000  Group Topic/Focus:  Goals Group:   The focus of this group is to help patients establish daily goals to achieve during treatment and discuss how the patient can incorporate goal setting into their daily lives to aide in recovery.  Participation Level:  Minimal  Participation Quality:  Appropriate, Attentive and Sharing  Affect:  Blunted  Cognitive:  Appropriate  Insight:  Good  Engagement in Group:  Good  Additional Comments:  Lajoya attended goals group, was somewhat active in discussion on what S.M.A.R.T. Goals are, how to use them and their benefits. [place] participated in activity writing one goals he would like to accomplish today on a leaf and adding it to the hall tree. Kindsey stated her daily goal was to eat a full lunch and dinner to take care of her body and her goals for discharge was to go to [facility] for 28-90 days at discharge.  Wandra Scot 06/13/2012, 12:05 PM

## 2012-06-13 NOTE — Progress Notes (Signed)
D: Patient resting in bed on left side with eyes closed. Respiration even and unlabored. In no apparent distress. A: Staff to monitor Q 15 mins for safety. R: Patient remains safe on the unit.

## 2012-06-13 NOTE — Progress Notes (Signed)
Pt attended discharge planning group and actively participated in group.  SW provided pt with today's workbook.  Pt presents with agitated affect and mood.  Pt was in group for a short time before getting into an argument with a peer and leaving group.  SW spoke with pt individually at this times.  Pt states that she spoke with her mom and will be able to go to [facility] on Thursday.  Pt states that her mom or brother will be able to transport her there Thursday morning.  No further needs voiced by pt at this time.    Reyes Ivan, LCSWA 06/13/2012  9:45 AM

## 2012-06-13 NOTE — Progress Notes (Signed)
Patient ID: Alicia Golden, female   DOB: [birth year], 31 y.o.   MRN: [id number] She has been up and about and to parts of the groups. Has been interacting with peers and staff. Has been agitated today because of not being able to listen to the radio and because of getting involved with other patients problem. She has been encouraged not to get involved.  She refused to completely feel out her self inventory.

## 2012-06-14 ENCOUNTER — Encounter (HOSPITAL_COMMUNITY): Payer: Self-pay | Admitting: Psychiatry

## 2012-06-14 DIAGNOSIS — F1120 Opioid dependence, uncomplicated: Secondary | ICD-10-CM

## 2012-06-14 DIAGNOSIS — F603 Borderline personality disorder: Secondary | ICD-10-CM

## 2012-06-14 DIAGNOSIS — F3175 Bipolar disorder, in partial remission, most recent episode depressed: Secondary | ICD-10-CM | POA: Diagnosis present

## 2012-06-14 DIAGNOSIS — F1920 Other psychoactive substance dependence, uncomplicated: Secondary | ICD-10-CM | POA: Diagnosis present

## 2012-06-14 HISTORY — DX: Bipolar disorder, in partial remission, most recent episode depressed: F31.75

## 2012-06-14 LAB — GC/CHLAMYDIA PROBE AMP, URINE
Chlamydia, Swab/Urine, PCR: NEGATIVE
GC Probe Amp, Urine: NEGATIVE

## 2012-06-14 MED ORDER — ADULT MULTIVITAMIN W/MINERALS CH: 1.0000 | ORAL_TABLET | Freq: Every day | ORAL | Status: DC

## 2012-06-14 MED ORDER — GABAPENTIN 600 MG PO TABS
600.0000 mg | ORAL_TABLET | ORAL | Status: DC
  Filled 2012-06-14 (×2): qty 1

## 2012-06-14 MED ORDER — QUETIAPINE FUMARATE 100 MG PO TABS
100.0000 mg | ORAL_TABLET | ORAL | Status: DC
  Filled 2012-06-14 (×3): qty 1

## 2012-06-14 MED ORDER — TRAZODONE HCL 100 MG PO TABS
100.0000 mg | ORAL_TABLET | Freq: Every day | ORAL | Status: DC
  Administered 2012-06-14: 100 mg via ORAL
  Filled 2012-06-14 (×2): qty 1

## 2012-06-14 MED ORDER — NICOTINE POLACRILEX 2 MG MT GUM: 2.0000 mg | CHEWING_GUM | OROMUCOSAL | Status: DC | PRN

## 2012-06-14 MED ORDER — GABAPENTIN 600 MG PO TABS: 600.0000 mg | ORAL_TABLET | ORAL | Status: AC

## 2012-06-14 MED ORDER — TRAZODONE HCL 100 MG PO TABS: 100.0000 mg | ORAL_TABLET | Freq: Every day | ORAL | Status: DC

## 2012-06-14 MED ORDER — IBUPROFEN 800 MG PO TABS
800.0000 mg | ORAL_TABLET | ORAL | Status: DC
  Administered 2012-06-14: 800 mg via ORAL
  Filled 2012-06-14 (×5): qty 1

## 2012-06-14 MED ORDER — IBUPROFEN 800 MG PO TABS
800.0000 mg | ORAL_TABLET | ORAL | Status: DC
  Filled 2012-06-14 (×2): qty 1

## 2012-06-14 MED ORDER — LITHIUM CARBONATE 300 MG PO CAPS: 300.0000 mg | ORAL_CAPSULE | Freq: Three times a day (TID) | ORAL | Status: DC

## 2012-06-14 MED ORDER — IBUPROFEN 800 MG PO TABS: 800.0000 mg | ORAL_TABLET | ORAL | Status: AC

## 2012-06-14 MED ORDER — TRAZODONE HCL 100 MG PO TABS
100.0000 mg | ORAL_TABLET | Freq: Every day | ORAL | Status: DC
  Filled 2012-06-14: qty 14

## 2012-06-14 MED ORDER — LITHIUM CARBONATE 300 MG PO CAPS
300.0000 mg | ORAL_CAPSULE | Freq: Three times a day (TID) | ORAL | Status: DC
  Filled 2012-06-14 (×2): qty 42

## 2012-06-14 MED ORDER — QUETIAPINE FUMARATE 200 MG PO TABS
100.0000 mg | ORAL_TABLET | ORAL | Status: DC
  Filled 2012-06-14: qty 35

## 2012-06-14 MED ORDER — QUETIAPINE FUMARATE 100 MG PO TABS: ORAL_TABLET | ORAL | Status: DC

## 2012-06-14 MED ORDER — LORAZEPAM 1 MG PO TABS
2.0000 mg | ORAL_TABLET | Freq: Once | ORAL | Status: AC
  Administered 2012-06-14: 2 mg via ORAL
  Filled 2012-06-14: qty 1

## 2012-06-14 MED ORDER — GABAPENTIN 300 MG PO CAPS
600.0000 mg | ORAL_CAPSULE | ORAL | Status: DC
  Administered 2012-06-14: 600 mg via ORAL
  Filled 2012-06-14 (×5): qty 2

## 2012-06-14 NOTE — Tx Team (Signed)
Interdisciplinary Treatment Plan Update (Adult)  Date:  06/14/2012  Time Reviewed:  10:08 AM   Progress in Treatment: Attending groups: Yes Participating in groups:  Yes Taking medication as prescribed: Yes Tolerating medication:  Yes Family/Significant othe contact made: Yes  Patient understands diagnosis:  Yes Discussing patient identified problems/goals with staff:  Yes Medical problems stabilized or resolved:  Yes Denies suicidal/homicidal ideation: Yes Issues/concerns per patient self-inventory:  None identified Other: N/A  New problem(s) identified: None Identified  Reason for Continuation of Hospitalization: Stable to d/c  Interventions implemented related to continuation of hospitalization: Stable to d/c  Additional comments: N/A  Estimated length of stay: D/C today  Discharge Plan: Pt will follow up at [facility] for further treatment.  New goal(s): N/A  Review of initial/current patient goals per problem list:    1.  Goal (s): Reduce depressive and anxiety symptoms  Met:  Yes  Target date: by discharge  As evidenced by: Reducing depression from a 10 to a 3 as reported by pt.  Pt rates at a 1 today.    Attendees: Patient:     Family:     Physician:  Lupe Carney, DO 06/14/2012 10:08 AM   Nursing: Roswell Miners, RN 06/14/2012 10:08 AM   Case Manager:  Reyes Ivan, LCSWA 06/14/2012 10:08 AM   Counselor:  Ronda Fairly, LCSWA 06/14/2012 10:08 AM   Other:  Estevan Ryder, RN  06/14/2012 10:08 AM   Other:  Robbie Louis, RN 06/14/2012 10:08 AM   Other:     Other:      Scribe for Treatment Team:   Reyes Ivan 06/14/2012 10:08 AM

## 2012-06-14 NOTE — Progress Notes (Signed)
Patient ID: Alicia Golden, female   DOB: [birth year], 32 y.o.   MRN: [id number] Pt has been labile today, She gets involved in others problems to the point of being tearful and agitated. Prn ativan was  given today d/t her agitation and yelling and threading peers.

## 2012-06-14 NOTE — Progress Notes (Signed)
Patient ID: Alicia Golden, female   DOB: [birth year], 31 y.o.   MRN: [id number]  Problem: Opioid Dependence  D: Pt out in milieu interacting well with peers. Pt watching t.v. and eating snacks.   A: Monitor patient Q 15 minutes for safety, encourage staff/peer interaction. Administer medications as administered as ordered by MD.  R: Pt. Compliant with medications and participated in group session. Pt denies SI at this time. No inappropriate behaviors noted at this time.

## 2012-06-14 NOTE — Progress Notes (Signed)
[facility] Group Notes:  (Counselor/Nursing/MHT/Case Management/Adjunct) Counseling Group 06/14/2012 1:15 to 2:30 PM    Type of Therapy:  Group Therapy  Participation Level:  Active  Participation Quality:  Attentive and Sharing  Affect:  Depressed, Excited and Irritable  Cognitive:  Alert and Oriented  Insight:  Limited  Engagement in Group:  Good  Engagement in Therapy:  Limited  Modes of Intervention:  Clarification, Limit-setting, Problem-solving and Support  Summary of Progress/Problems: The focus of this group session was to process how we deal with difficult emotions and share with others the patterns that play out when we are reacting to the emotion verses the situation.  Shaynah shared that the most difficult emotions she deals with is shame, guilt and remorse. "I think that is what makes me want to keep every body at a distance."  Gricelda was open to seeing that she keeps her walls up in order to protect her drug use. Patient was drowsy at only two points in group session today verses entire session. Staisha became very distraught emotionally once patient escalated towards CERT in hallway.  She was responsive to multiple limits for extended period of time until escalation rose and she was escorted from room by MHT.     [name] 06/14/2012, 4:34 PM

## 2012-06-14 NOTE — Progress Notes (Signed)
Psychoeducational Group Note  Date:  06/14/2012 Time:  1100  Group Topic/Focus:  Crisis Planning:   The purpose of this group is to help patients create a crisis plan for use upon discharge or in the future, as needed.  Participation Level:  sleeping  Participation Quality:    Affect:    Cognitive:    Insight:    Engagement in Group:    Additional Comments:  Pt was sleeping in the group.  Isla Pence M 06/14/2012, 3:51 PM

## 2012-06-14 NOTE — Progress Notes (Signed)
Patient ID: Alicia Golden, female   DOB: 02-07-1981, 31 y.o.   MRN: [id number]  Problem: Polysubstance Abuse, Bipolar Disorder, Borderline Personality  D: Pt out in milieu interacting well with other patients; no inappropriate behaviors noted.  A: Monitor patient Q 15 minutes for safety, encourage staff/peer interaction, administer medications as ordered by MD.  R: Pt compliant with medications, and participated in group session. Pt denies SI, and states she is ready to leave in the am to go to [facility].

## 2012-06-14 NOTE — [facility] Suicide Risk Assessment (Signed)
Suicide Risk Assessment  Discharge Assessment     Demographic factors:  Caucasian;Low socioeconomic status  Current Mental Status Per Nursing Assessment::   On Admission:    At Discharge: Time was spent today discussing with the patient her current symptoms. The patient reports to having ongoing difficulty initiating and maintaining sleep and reports to sleeping well when she was given the medication Trazodone for sleep.  The patient also reports a decrease appetite today but reports it is improving.  She denies any significant feelings of sadness, anhedonia or depressed mood and adamantly denies any suicidal or homicidal ideations.  She denies any auditory or visual hallucinations or delusional thinking and reports mild anxiety symptoms. The patient denies any symptoms of substance withdrawal and feels ready for discharge.  The patient will be discharged in the morning to enter the [facility] for longer term treatment of her substance abuse issues.  Current Mental Status Per Physician:  Diagnosis:  Axis I:  Polysubstance Dependence Including Heroin, Alcohol and Cannabis.  Bipolar I Disorder - Under Good Control. Axis II: Borderline Personality Disorder.   The patient was seen today and reports the following:   ADL's: Good.  Sleep: The patient reports to having ongoing difficulty initiating and maintaining sleep but states she slept well when given the medication Trazodone. Appetite: The patient reports a decreased appetite today but reports this is improving.  Mild>(1-10) >Severe  Hopelessness (1-10): 0  Depression (1-10): 0  Anxiety (1-10): 2   Suicidal Ideation: The patient adamantly denies any suicidal ideations today.  Plan: No  Intent: No  Means: No   Homicidal Ideation: The patient adamantly denies any homicidal ideations today.  Plan: No  Intent: No.  Means: No   General Appearance/Behavior: The patient was friendly and cooperative today with this  provider.  Eye Contact: Good.  Speech: Appropriate in rate and volume with no pressuring noted.  Motor Behavior: wnl.  Level of Consciousness: Alert  Mental Status: Alert and Oriented x 3.  Mood: Euthymic today.  Affect: Bright and Full.  Anxiety Level: Mild anxiety reported today.  Thought Process: wnl.  Thought Content: The patient denies any auditory or visual hallucinations today.  She also denies any delusional thinking today. Perception: wnl.  Judgment: Fair to Good.  Insight: Fair to Good.  Cognition: Appropriate.   Loss Factors: Decrease in vocational status;Legal issues  Historical Factors: Long History of Substance Abuse and Psychiatric Issues.  Risk Reduction Factors:   Good access to healthcare.  Good insight into illness.  Continued Clinical Symptoms:  Alcohol/Substance Abuse/Dependencies Previous Psychiatric Diagnoses and Treatments Medical Diagnoses and Treatments/Surgeries  Discharge Diagnoses:   AXIS I:   Polysubstance Dependence Including Heroin, Alcohol and Cannabis.   Bipolar I Disorder - Under Good Control. Axis II:  Borderline Personality Disorder.  AXIS III:  1.  Degenerative Disk Disease. AXIS IV:   Long Standing Psychiatric Issues.  Long Standing Substance Abuse Issues. AXIS V:   GAF at time of admission approximately 40.  GAF at time of discharge approximately 55.  Cognitive Features That Contribute To Risk:  None Noted.   Current Medications:    . gabapentin  600 mg Oral [id number]  . ibuprofen  800 mg Oral BH-q8a2phs  . lithium carbonate  300 mg Oral TID WC  . LORazepam  2 mg Oral Once  . multivitamin with minerals  1 tablet Oral Daily  . QUEtiapine  100 mg Oral BH-q8a2p  . QUEtiapine  300 mg Oral QHS  . thiamine  100 mg Oral Daily  . traZODone  100 mg Oral QHS  . DISCONTD: gabapentin  600 mg Oral TID  . DISCONTD: gabapentin  600 mg Oral BH-q8a2phs  . DISCONTD: ibuprofen  800 mg Oral TID  . DISCONTD: ibuprofen  800 mg Oral  BH-q8a2phs  . DISCONTD: lithium carbonate  300 mg Oral TID WC  . DISCONTD: QUEtiapine  100 mg Oral BID  . DISCONTD: QUEtiapine  100 mg Oral BH-q8a2p  . DISCONTD: thiamine  100 mg Intramuscular Once  . DISCONTD: traZODone  100 mg Oral QHS   Review of Systems:  Neurological: The patient denies any headaches today. She denies any seizures or dizziness.  G.I.: The patient denies any constipation today or G.I. Upset.  Musculoskeletal: The patient reports some lower back pain which is under adequate control.  Time was spent today discussing with the patient her current symptoms. The patient reports to having ongoing difficulty initiating and maintaining sleep and reports to sleeping well when she was given the medication Trazodone for sleep.  The patient also reports a decrease appetite today but reports it is improving.  She denies any significant feelings of sadness, anhedonia or depressed mood and adamantly denies any suicidal or homicidal ideations.  She denies any auditory or visual hallucinations or delusional thinking and reports mild anxiety symptoms. The patient denies any symptoms of substance withdrawal and feels ready for discharge.  The patient will be discharged in the morning to enter the [facility] for longer term treatment of her substance abuse issues.   Treatment Plan Summary:  1. Daily contact with patient to assess and evaluate symptoms and progress in treatment  2. Medication management  3. The patient will deny suicidal ideations or homicidal ideations for 48 hours prior to discharge and have a depression and anxiety rating of 3 or less. The patient will also deny any auditory or visual hallucinations or delusional thinking and display no manic or hypomanic behaviors.  4. The patient will deny any symptoms of substance withdrawal at time of discharge.   Plan:  1. Will continue the patient on her prescribed medications as listed above. 2. Will start Trazodone  at 100 mgs po qhs for sleep. 3. Laboratory Studies reviewed.  4. Will continue to monitor.  5. Discharge tomorrow at 6 am to enter the [facility] for longer term treatment of her substance abuse issues.  Suicide Risk:  Minimal: No identifiable suicidal ideation.  Patients presenting with no risk factors but with morbid ruminations; may be classified as minimal risk based on the severity of the depressive symptoms  Plan Of Care/Follow-up recommendations:  Activity:  As tolerated. Diet:  Regular Diet. Other:  Please take all medications only as directed and keep all scheduled follow up appointments.  Please also abstain from any use of alcohol or illicit drugs.  Alicia Golden 06/14/2012, 3:50 PM

## 2012-06-14 NOTE — Progress Notes (Signed)
Pt attended discharge planning group and actively participated in group.  SW provided pt with today's workbook.  Pt presents with calm mood and affect.  Pt denies having depression, anxiety and SI.  Pt wants to d/c today to visit her kids and get her belongings together before going to [facility] tomorrow.  Treatment team doesn't feel this is an appropriate plan.  Pt will be d/c tomorrow morning and reports her family will transport her to [facility] in the morning.  No further needs voiced by pt at this time.  Safety planning and suicide prevention discussed.  Pt participated in discussion and acknowledged an understanding of the information provided.       Reyes Ivan, LCSWA 06/14/2012  10:35 AM

## 2012-06-14 NOTE — Progress Notes (Signed)
Patient request/signed 72 hour notice today 06/14/2012 at 0734. Patient states "I want to go home to buy the things I will need and prepare for rehab." Patient educated re 72 hour notice, patient verbalizes understanding. Patient remains safe with Q15 minute checks for safety. Will continue to monitor.

## 2012-06-14 NOTE — Progress Notes (Signed)
Psychoeducational Group Note  Date:  06/14/2012 Time: 2000  Group Topic/Focus:  Wrap-Up Group:   The focus of this group is to help patients review their daily goal of treatment and discuss progress on daily workbooks.  Participation Level:  Active  Participation Quality:  Alert  Affect:    Cognitive:    Insight:    Engagement in Group:    Additional Comments:  Patient did attend AA meeting tonight.   Polk Minor, Newton Pigg 06/14/2012, 10:18 PM

## 2012-06-15 NOTE — Progress Notes (Signed)
Patient ID: Alicia Golden, female   DOB: [birth year], 31 y.o.   MRN: [id number]  Pt discharged to services of [facility]. Pt to be transported by family. Pt verbalizes and understanding of follow up appointments and medication administration. Pt denies SI or plans to harm herself at this time.

## 2012-06-20 LAB — HIV-1 RNA, QUALITATIVE, TMA: HIV-1 RNA, Qualitative, TMA: NOT DETECTED

## 2012-07-01 ENCOUNTER — Encounter (HOSPITAL_BASED_OUTPATIENT_CLINIC_OR_DEPARTMENT_OTHER): Payer: Self-pay | Admitting: *Deleted

## 2012-07-01 ENCOUNTER — Emergency Department (HOSPITAL_BASED_OUTPATIENT_CLINIC_OR_DEPARTMENT_OTHER)
Admission: EM | Admit: 2012-07-01 | Discharge: 2012-07-01 | Disposition: A | Discharge status: Home / Self Care | Payer: Self-pay | Attending: Emergency Medicine | Admitting: Emergency Medicine

## 2012-07-01 DIAGNOSIS — X58XXXA Exposure to other specified factors, initial encounter: Secondary | ICD-10-CM | POA: Insufficient documentation

## 2012-07-01 DIAGNOSIS — F319 Bipolar disorder, unspecified: Secondary | ICD-10-CM | POA: Insufficient documentation

## 2012-07-01 DIAGNOSIS — J45909 Unspecified asthma, uncomplicated: Secondary | ICD-10-CM | POA: Insufficient documentation

## 2012-07-01 DIAGNOSIS — K047 Periapical abscess without sinus: Secondary | ICD-10-CM

## 2012-07-01 DIAGNOSIS — S025XXA Fracture of tooth (traumatic), initial encounter for closed fracture: Secondary | ICD-10-CM | POA: Insufficient documentation

## 2012-07-01 DIAGNOSIS — F17200 Nicotine dependence, unspecified, uncomplicated: Secondary | ICD-10-CM | POA: Insufficient documentation

## 2012-07-01 DIAGNOSIS — K029 Dental caries, unspecified: Secondary | ICD-10-CM | POA: Insufficient documentation

## 2012-07-01 DIAGNOSIS — K0889 Other specified disorders of teeth and supporting structures: Secondary | ICD-10-CM

## 2012-07-01 DIAGNOSIS — IMO0002 Reserved for concepts with insufficient information to code with codable children: Secondary | ICD-10-CM | POA: Insufficient documentation

## 2012-07-01 MED ORDER — PENICILLIN V POTASSIUM 500 MG PO TABS: 500.0000 mg | ORAL_TABLET | Freq: Four times a day (QID) | ORAL | Status: AC

## 2012-07-01 MED ORDER — IBUPROFEN 800 MG PO TABS: 800.0000 mg | ORAL_TABLET | Freq: Three times a day (TID) | ORAL | Status: AC

## 2012-07-01 NOTE — ED Provider Notes (Signed)
History     CSN: [id number]  Arrival date & time 07/01/12  1536   First MD Initiated Contact with Patient 07/01/12 1616      Chief Complaint  Patient presents with  . Dental Pain    (Consider location/radiation/quality/duration/timing/severity/associated sxs/prior treatment) Patient is a 31 y.o. female presenting with tooth pain. The history is provided by the patient and medical records.  Dental PainPrimary symptoms do not include headaches, fever, shortness of breath or cough.  Additional symptoms include: ear pain. Additional symptoms do not include: fatigue.   Alicia Golden is a 31 y.o. female presents to the emergency department complaining of dental pain.  The onset of the symptoms was  gradual starting 2 month ago.  The patient has associated pain with eating, swelling of the gums around the tooth, L ear pain.  The symptoms have been  persistent, gradually worsened.  Eating, cold/hot drinks makes the symptoms worse and ibuprofen makes symptoms some better.  The patient denies fever, chills, headache, nausea, vomiting, diarrhea.  Pt states she broke the tooth approx 2 mos ago and it has been painful to eat since then.  She developed constant, worsening pain in the last week with associated ear pain and swelling of the gums.  She has a Hx of caries, broken teeth and infection.      Past Medical History  Diagnosis Date  . Degenerative disk disease   . Asthma   . Bipolar disorder   . Bipolar I disorder, in partial remission 06/14/2012    History reviewed. No pertinent past surgical history.  History reviewed. No pertinent family history.  History  Substance Use Topics  . Smoking status: Current Everyday Smoker -- 2.0 packs/day    Types: Cigarettes  . Smokeless tobacco: Not on file  . Alcohol Use: Yes    OB History    Grav Para Term Preterm Abortions TAB SAB Ect Mult Living                  Review of Systems  Constitutional: Negative for fever, diaphoresis, appetite  change, fatigue and unexpected weight change.  HENT: Positive for ear pain and dental problem. Negative for mouth sores and neck stiffness.   Eyes: Negative for visual disturbance.  Respiratory: Negative for cough, chest tightness, shortness of breath and wheezing.   Cardiovascular: Negative for chest pain.  Gastrointestinal: Negative for nausea, vomiting, abdominal pain, diarrhea and constipation.  Genitourinary: Negative for dysuria, urgency, frequency and hematuria.  Skin: Negative for rash.  Neurological: Negative for syncope, light-headedness and headaches.  Psychiatric/Behavioral: Negative for disturbed wake/sleep cycle. The patient is not nervous/anxious.     Allergies  Review of patient's allergies indicates no known allergies.  Home Medications   Current Outpatient Rx  Name Route Sig Dispense Refill  . GABAPENTIN 600 MG PO TABS Oral Take 1 tablet (600 mg total) by mouth 3 (three) times daily at 8am, 2pm and bedtime. For anxiety and mood stabilization. 90 tablet 0  . LAMOTRIGINE 100 MG PO TABS Oral Take 50 mg by mouth daily.    Marland Kitchen LAMOTRIGINE 25 MG PO TABS Oral Take 25 mg by mouth daily. For 10 days    . LITHIUM CARBONATE ER 300 MG PO TBCR Oral Take 900 mg by mouth at bedtime.    Marland Kitchen QUETIAPINE FUMARATE 300 MG PO TABS Oral Take 600 mg by mouth at bedtime.    . TRAZODONE HCL 100 MG PO TABS Oral Take 1 tablet (100 mg total) by mouth at  bedtime. For sleep. 30 tablet 0    BP 123/84  Pulse 86  Temp 98.2 F (36.8 C) (Oral)  Resp 18  Ht 5\' 6"  (1.676 m)  Wt 131 lb (59.421 kg)  BMI 21.14 kg/m2  SpO2 100%  LMP 05/15/2012  Physical Exam  Nursing note and vitals reviewed. Constitutional: She appears well-developed and well-nourished. No distress.  HENT:  Head: Normocephalic and atraumatic.  Right Ear: Tympanic membrane, external ear and ear canal normal.  Left Ear: Tympanic membrane, external ear and ear canal normal.  Nose: Nose normal. Right sinus exhibits no maxillary sinus  tenderness and no frontal sinus tenderness. Left sinus exhibits no maxillary sinus tenderness and no frontal sinus tenderness.  Mouth/Throat: Oropharynx is clear and moist. No oropharyngeal exudate.    Eyes: Conjunctivae are normal. No scleral icterus.  Neck: Normal range of motion. Neck supple.  Cardiovascular: Normal rate, regular rhythm, normal heart sounds and intact distal pulses.  Exam reveals no gallop and no friction rub.   No murmur heard. Pulmonary/Chest: Effort normal and breath sounds normal. No respiratory distress. She has no wheezes.  Musculoskeletal: Normal range of motion. She exhibits no edema.  Neurological: She is alert.       Speech is clear and goal oriented Moves extremities without ataxia  Skin: Skin is warm and dry. She is not diaphoretic.  Psychiatric: She has a normal mood and affect.    ED Course  Procedures (including critical care time)  Labs Reviewed - No data to display No results found.   No diagnosis found.    MDM  Alicia Golden presents for pain x1 week. No drainage from the site. Cavity and broken tooth in the area. No gross abscess.  Exam unconcerning for Ludwig's angina or spread of infection.  Will treat with penicillin and pain medicine.   Patient with a marking unable to take narcotic pain medications. Advised to take ibuprofen as Urged patient to follow-up with dentist.  Will prescribe Pen V K for possible infection. No abscess that needs draining.  Also has her followup with dentistry.  I have also discussed reasons to return immediately to the ER.  Patient expresses understanding and agrees with plan.  1. Medications: penicillin 2. Treatment: warm salt water swish, ibuprofen for pain 3. Follow Up: with dentistry   You have a dental injury. Use the resource guide listed below to help you find a dentist if you do not already have one to followup with. It is very important that you get evaluated by a dentist as soon as possible. Call  tomorrow to schedule an appointment. Use your pain medication as prescribed and do not operate heavy machinery while on pain medication. Note that your pain medication contains acetaminophen (Tylenol) & its is not reccommended that you use additional acetaminophen (Tylenol) while taking this medication. Take your full course of antibiotics. Read the instructions below.  Eat a soft or liquid diet and rinse your mouth out after meals with warm water. You should see a dentist or return here at once if you have increased swelling, increased pain or uncontrolled bleeding from the site of your injury.   SEEK MEDICAL CARE IF:   You have increased pain not controlled with medicines.   You have swelling around your tooth, in your face or neck.   You have bleeding which starts, continues, or gets worse.   You have a fever >101  If you are unable to open your mouth  RESOURCE GUIDE  Dental Problems  Patients with Medicaid: [facility] [phone] [address]                                           [phone] Phone:  [phone]                                                  Phone:  [phone]  If unable to pay or uninsured, contact:  [facility] or [facility] to become qualified for the adult dental clinic.  Chronic Pain Problems Contact [name]  [phone] Patients need to be referred by their primary care doctor.  Insufficient Money for Medicine Contact [employer]:  call "211" or [facility] 531-236-0482.  No Primary Care Doctor Call [facility]  [phone] Other agencies that provide inexpensive medical care    [facility]  [phone]    [facility]  [phone]    [facility]  [phone]    [facility]  [phone]    [facility]  [phone])[phone]    [facility]  7200721345  Psychological Services [facility]   [phone] [facility]  [phone] [facility]   [phone] (emergency services 6671721529)  Substance Abuse Resources [facility]  769-639-9472 [facility] 507-688-3512 [facility] [phone] Floydene Flock [phone] Residential & Outpatient Substance Abuse Program  ([phone])181-2110  Abuse/Neglect [facility] Child Abuse Hotline 706-562-8263 [facility] Child Abuse Hotline (563)340-6695 (After Hours)  Emergency Shelter [facility] (205)489-2506  M[facility] [place] [facility] [place] (724)442-1047 [facility] (859)[phone]  MRSA Hotline #:   (562)276-8804    [facility] Resources  [facility]     [employer]                          [facility] Dept. [address] [place]                       [address]      [address]  Blondell Reveal Phone:  [phone]                                   Phone:  [phone]                 Phone:  [phone]  [facility] Phone:  [phone]  [facility] Child Abuse Hotline (  336) D4935333 (270)[phone] (After Hours)            Dierdre Forth, PA-C 07/01/12 1705

## 2012-07-01 NOTE — ED Notes (Addendum)
Pt states her tooth has been hurting for a couple months. Now feels like pain is radiating down into throat. Pt goes to [facility]. Periods are irregular.

## 2012-07-02 NOTE — ED Provider Notes (Signed)
Medical screening examination/treatment/procedure(s) were performed by non-physician practitioner and as supervising physician I was immediately available for consultation/collaboration.  Davinder Haff, MD 07/02/12 0702 

## 2012-07-09 NOTE — Discharge Summary (Signed)
Physician Discharge Summary Note  Patient:  Alicia Golden is an 31 y.o., female MRN:  [id number] DOB:  03/17/1981 Patient phone:  [phone] (home)  Patient address:   [address] [place] [address]   Date of Admission:  06/07/2012 Date of Discharge: 06/15/2012  Discharge Diagnoses: Principal Problem:  *Polysubstance dependence including opioid type drug, continuous use Active Problems:  Bipolar I disorder, in partial remission  Borderline personality disorder  Axis Diagnosis:  AXIS I: Polysubstance Dependence Including Heroin, Alcohol and Cannabis.  Bipolar I Disorder - Under Good Control.  Axis II: Borderline Personality Disorder.  AXIS III: 1. Degenerative Disk Disease.  AXIS IV: Long Standing Psychiatric Issues. Long Standing Substance Abuse Issues.  AXIS V: GAF at time of admission approximately 40. GAF at time of discharge approximately 55.   Level of Care:   Inpatient hospitalization.   Reason for Admission:  Pt is a 31 y/o WF who has been evaluated and medically cleared per [name] for matriculation into drug/alcohol detox program. The patients drug of choice is Heroin. Patient last used Heroin 1 gm at 2 pm on 8/13. Pt also drinks a fifth of Vodka daily with last drink at 7 pm 8/12, pt gives a history of daily heroin use at a gram/day. Patient also smokes marijuana and has a positive UDS for opiates and THC. Patient has a prior h/o of drug detox from oxycodone 05/2011 at [facility]. Pt notes positive family h/o of polysubstance abuse i.e. Both parents alcoholics with her mother using crack in the past. Pt also has a sister and brother who are both alcoholics. Patient is single, unemployed G7,P3,A0,MC4 and living with her mother. The patient's children currently live with their father. Pt also uses tobacco products i.e.1 1/2 PPD smoker. Pt also has a psychiatric hx to include Bipolar D/O dx 5 years ago but doesn't have a Psychiatric provider since  her last hospitalization for a suicide attempt ( cutting her wrist) 07/2011 at [facility]. Pt has been non compliant with her then rx psychotropics to include Xanax, Lithium, Neurontin and Restoril. Pt is dealing with some anxiety concerns, which she feel is coming due to her looming detox and rates her anxiety sx 8/10. Pt feels depressed due to not having her children around with some hopelessness. Pt denies racing thoughts, decreased concentration, insomnia, delusional thoughts, homicidal ideations, visual and or auditory hallucinations or h/o of PTSD. Pt also denies any h/o of sexual abuse as a child.  Hospital Course:   The patient attended treatment team meeting this am and met with treatment team members. The patient's symptoms, treatment plan and response to treatment was discussed. The patient endorsed that their symptoms have improved. The patient also stated that they felt stable for discharge.  They reported that from this hospital stay they had learned many coping skills.  In other to maintain their psychiatric stability, they will continue psychiatric care on an outpatient basis. They will follow-up as outlined below.  In addition they were instructed  to take all your medications as prescribed by their mental healthcare provider and to report any adverse effects and or reactions from your medicines to their outpatient provider promptly.  The patient is also instructed and cautioned to not engage in alcohol and or illegal drug use while on prescription medicines.  In the event of worsening symptoms the patient is instructed to call the crisis hotline, 911 and or go to the nearest ED for appropriate evaluation and treatment of  symptoms.   Also while a patient in this hospital, the patient received medication management for his psychiatric symptoms. They were ordered and received as outlined below:    Medication List     As of 07/09/2012  9:07 PM    STOP taking these medications          ALPRAZolam 1 MG tablet   Commonly known as: XANAX      tacrolimus 0.1 % ointment   Commonly known as: PROTOPIC      TAKE these medications      Indication    gabapentin 600 MG tablet   Commonly known as: NEURONTIN   Take 1 tablet (600 mg total) by mouth 3 (three) times daily at 8am, 2pm and bedtime. For anxiety and mood stabilization.       traZODone 100 MG tablet   Commonly known as: DESYREL   Take 1 tablet (100 mg total) by mouth at bedtime. For sleep.        They were also enrolled in group counseling sessions and activities in which they participated actively.       Follow-up Information    Follow up with [facility] on 06/15/2012. (Arrive there at 8:00 am promptly!)    Contact information:   [address] [place], [place] [address] [phone]        Upon discharge, patient adamantly denies suicidal, homicidal ideations, auditory, visual hallucinations and or delusional thinking. They left [facility] with all personal belongings via public transportation in no apparent distress.  Consults:  Please see the patient's electronic medical record for more details.   Significant Diagnostic Studies:  Please see the patient's electronic medical record for more details.   Discharge Vitals:   Blood pressure 134/79, pulse 68, temperature 97.3 F (36.3 C), temperature source Oral, resp. rate 15, height 4\' 8"  (1.422 m), weight 54.432 kg (120 lb), last menstrual period 05/15/2012..  Mental Status Exam: Demographic factors:  Caucasian;Low socioeconomic status   Current Mental Status Per Nursing Assessment::  On Admission:  At Discharge: Time was spent today discussing with the patient her current symptoms. The patient reports to having ongoing difficulty initiating and maintaining sleep and reports to sleeping well when she was given the medication Trazodone for sleep. The patient also reports a decrease appetite today but reports it is improving. She denies any significant  feelings of sadness, anhedonia or depressed mood and adamantly denies any suicidal or homicidal ideations. She denies any auditory or visual hallucinations or delusional thinking and reports mild anxiety symptoms. The patient denies any symptoms of substance withdrawal and feels ready for discharge.  The patient will be discharged in the morning to enter the [facility] for longer term treatment of her substance abuse issues.   Current Mental Status Per Physician:  Diagnosis:  Axis I: Polysubstance Dependence Including Heroin, Alcohol and Cannabis.  Bipolar I Disorder - Under Good Control.  Axis II: Borderline Personality Disorder.   The patient was seen today and reports the following:   ADL's: Good.  Sleep: The patient reports to having ongoing difficulty initiating and maintaining sleep but states she slept well when given the medication Trazodone.  Appetite: The patient reports a decreased appetite today but reports this is improving.   Mild>(1-10) >Severe  Hopelessness (1-10): 0  Depression (1-10): 0  Anxiety (1-10): 2  Suicidal Ideation: The patient adamantly denies any suicidal ideations today.  Plan: No  Intent: No  Means: No   Homicidal Ideation: The patient adamantly  denies any homicidal ideations today.  Plan: No  Intent: No.  Means: No   General Appearance/Behavior: The patient was friendly and cooperative today with this provider.  Eye Contact: Good.  Speech: Appropriate in rate and volume with no pressuring noted.  Motor Behavior: wnl.  Level of Consciousness: Alert  Mental Status: Alert and Oriented x 3.  Mood: Euthymic today.  Affect: Bright and Full.  Anxiety Level: Mild anxiety reported today.  Thought Process: wnl.  Thought Content: The patient denies any auditory or visual hallucinations today. She also denies any delusional thinking today.  Perception: wnl.  Judgment: Fair to Good.  Insight: Fair to Good.  Cognition: Appropriate.   Loss  Factors:  Decrease in vocational status;Legal issues   Historical Factors:  Long History of Substance Abuse and Psychiatric Issues.   Risk Reduction Factors:  Good access to healthcare. Good insight into illness.   Continued Clinical Symptoms:  Alcohol/Substance Abuse/Dependencies  Previous Psychiatric Diagnoses and Treatments  Medical Diagnoses and Treatments/Surgeries   Discharge Diagnoses:  AXIS I: Polysubstance Dependence Including Heroin, Alcohol and Cannabis.  Bipolar I Disorder - Under Good Control.  Axis II: Borderline Personality Disorder.  AXIS III: 1. Degenerative Disk Disease.  AXIS IV: Long Standing Psychiatric Issues. Long Standing Substance Abuse Issues.  AXIS V: GAF at time of admission approximately 40. GAF at time of discharge approximately 55.  Cognitive Features That Contribute To Risk:  None Noted.   Current Medications:   .  gabapentin  600 mg  Oral  [id number]   .  ibuprofen  800 mg  Oral  [id number]   .  lithium carbonate  300 mg  Oral  TID WC   .  LORazepam  2 mg  Oral  Once   .  multivitamin with minerals  1 tablet  Oral  Daily   .  QUEtiapine  100 mg  Oral  [id number]   .  QUEtiapine  300 mg  Oral  QHS   .  thiamine  100 mg  Oral  Daily   .  traZODone  100 mg  Oral  QHS   .  DISCONTD: gabapentin  600 mg  Oral  TID   .  DISCONTD: gabapentin  600 mg  Oral  [id number]   .  DISCONTD: ibuprofen  800 mg  Oral  TID   .  DISCONTD: ibuprofen  800 mg  Oral  [id number]   .  DISCONTD: lithium carbonate  300 mg  Oral  TID WC   .  DISCONTD: QUEtiapine  100 mg  Oral  BID   .  DISCONTD: QUEtiapine  100 mg  Oral  BH-q8a2p   .  DISCONTD: thiamine  100 mg  Intramuscular  Once   .  DISCONTD: traZODone  100 mg  Oral  QHS    Review of Systems:  Neurological: The patient denies any headaches today. She denies any seizures or dizziness.  G.I.: The patient denies any constipation today or G.I. Upset.  Musculoskeletal: The patient reports some lower back pain which is  under adequate control.   Time was spent today discussing with the patient her current symptoms. The patient reports to having ongoing difficulty initiating and maintaining sleep and reports to sleeping well when she was given the medication Trazodone for sleep. The patient also reports a decrease appetite today but reports it is improving. She denies any significant feelings of sadness, anhedonia or depressed mood and adamantly denies any suicidal or homicidal ideations. She denies any  auditory or visual hallucinations or delusional thinking and reports mild anxiety symptoms. The patient denies any symptoms of substance withdrawal and feels ready for discharge.  The patient will be discharged in the morning to enter the [facility] for longer term treatment of her substance abuse issues.   Treatment Plan Summary:  1. Daily contact with patient to assess and evaluate symptoms and progress in treatment  2. Medication management  3. The patient will deny suicidal ideations or homicidal ideations for 48 hours prior to discharge and have a depression and anxiety rating of 3 or less. The patient will also deny any auditory or visual hallucinations or delusional thinking and display no manic or hypomanic behaviors.  4. The patient will deny any symptoms of substance withdrawal at time of discharge.   Plan:  1. Will continue the patient on her prescribed medications as listed above.  2. Will start Trazodone at 100 mgs po qhs for sleep.  3. Laboratory Studies reviewed.  4. Will continue to monitor.  5. Discharge tomorrow at 6 am to enter the [facility] for longer term treatment of her substance abuse issues.   Suicide Risk:  Minimal: No identifiable suicidal ideation. Patients presenting with no risk factors but with morbid ruminations; may be classified as minimal risk based on the severity of the depressive symptoms   Plan Of Care/Follow-up recommendations:  Activity:  As tolerated.  Diet: Regular Diet.  Other: Please take all medications only as directed and keep all scheduled follow up appointments. Please also abstain from any use of alcohol or illicit drugs.  Discharge destination:  [facility]  Is patient on multiple antipsychotic therapies at discharge:  No  Has Patient had three or more failed trials of antipsychotic monotherapy by history: N/A Recommended Plan for Multiple Antipsychotic Therapies: N/A  Discharge Orders    Future Orders Please Complete By Expires   Diet - low sodium heart healthy      Increase activity slowly      Discharge instructions      Comments:   Please take all medications only as directed and keep all scheduled follow up appointments.  Please abstain from any use of alcohol or illicit drugs.       Medication List     As of 07/09/2012  9:07 PM    STOP taking these medications         ALPRAZolam 1 MG tablet   Commonly known as: XANAX      tacrolimus 0.1 % ointment   Commonly known as: PROTOPIC      TAKE these medications      Indication    gabapentin 600 MG tablet   Commonly known as: NEURONTIN   Take 1 tablet (600 mg total) by mouth 3 (three) times daily at 8am, 2pm and bedtime. For anxiety and mood stabilization.       traZODone 100 MG tablet   Commonly known as: DESYREL   Take 1 tablet (100 mg total) by mouth at bedtime. For sleep.            Follow-up Information    Follow up with [facility] on 06/15/2012. (Arrive there at 8:00 am promptly!)    Contact information:   [address] [place], [place] [address] [phone]        Follow-up recommendations:   Activities: Resume typical activities Diet: Resume typical diet Other: Follow up with outpatient provider and report any side effects to out patient prescriber.  Comments:  Take all your medications as prescribed by your mental healthcare provider. Report any adverse effects and or reactions from your medicines to your  outpatient provider promptly. Patient is instructed and cautioned to not engage in alcohol and or illegal drug use while on prescription medicines. In the event of worsening symptoms, patient is instructed to call the crisis hotline, 911 and or go to the nearest ED for appropriate evaluation and treatment of symptoms.  Signed: Franchot Gallo 07/09/2012 9:07 PM

## 2012-08-04 ENCOUNTER — Encounter (HOSPITAL_BASED_OUTPATIENT_CLINIC_OR_DEPARTMENT_OTHER): Payer: Self-pay | Admitting: *Deleted

## 2012-08-04 ENCOUNTER — Emergency Department (HOSPITAL_BASED_OUTPATIENT_CLINIC_OR_DEPARTMENT_OTHER)
Admission: EM | Admit: 2012-08-04 | Discharge: 2012-08-04 | Disposition: A | Discharge status: Home / Self Care | Payer: Self-pay | Attending: Emergency Medicine | Admitting: Emergency Medicine

## 2012-08-04 DIAGNOSIS — F319 Bipolar disorder, unspecified: Secondary | ICD-10-CM | POA: Insufficient documentation

## 2012-08-04 DIAGNOSIS — K029 Dental caries, unspecified: Secondary | ICD-10-CM | POA: Insufficient documentation

## 2012-08-04 DIAGNOSIS — J45909 Unspecified asthma, uncomplicated: Secondary | ICD-10-CM | POA: Insufficient documentation

## 2012-08-04 DIAGNOSIS — F1121 Opioid dependence, in remission: Secondary | ICD-10-CM | POA: Insufficient documentation

## 2012-08-04 DIAGNOSIS — K0889 Other specified disorders of teeth and supporting structures: Secondary | ICD-10-CM

## 2012-08-04 DIAGNOSIS — F17200 Nicotine dependence, unspecified, uncomplicated: Secondary | ICD-10-CM | POA: Insufficient documentation

## 2012-08-04 MED ORDER — OXYCODONE-ACETAMINOPHEN 5-325 MG PO TABS: 1.0000 | ORAL_TABLET | Freq: Four times a day (QID) | ORAL | Status: DC | PRN

## 2012-08-04 NOTE — ED Provider Notes (Signed)
History     CSN: [id number]  Arrival date & time 08/04/12  1020   First MD Initiated Contact with Patient 08/04/12 1112      Chief Complaint  Patient presents with  . Dental Pain    (Consider location/radiation/quality/duration/timing/severity/associated sxs/prior treatment) HPI Pt with chronic dental pain due to 'cracked' teeth seen in the ED about a month ago for same, completed a course of Abx. Pain has been worse over the last few days, not improved with OTC pain medications. She has a history of heroin abuse, but has recently been through detox and treatment.   Past Medical History  Diagnosis Date  . Asthma   . Bipolar disorder   . Bipolar I disorder, in partial remission 06/14/2012    History reviewed. No pertinent past surgical history.  No family history on file.  History  Substance Use Topics  . Smoking status: Current Every Day Smoker -- 2.0 packs/day    Types: Cigarettes  . Smokeless tobacco: Not on file  . Alcohol Use: Yes    OB History    Grav Para Term Preterm Abortions TAB SAB Ect Mult Living                  Review of Systems All other systems reviewed and are negative except as noted in HPI.   Allergies  Vicodin  Home Medications   Current Outpatient Rx  Name Route Sig Dispense Refill  . BC HEADACHE POWDER PO Oral Take by mouth.    Marland Kitchen GABAPENTIN 600 MG PO TABS Oral Take 1 tablet (600 mg total) by mouth 3 (three) times daily at 8am, 2pm and bedtime. For anxiety and mood stabilization. 90 tablet 0  . LAMOTRIGINE 100 MG PO TABS Oral Take 50 mg by mouth daily.    Marland Kitchen LAMOTRIGINE 25 MG PO TABS Oral Take 25 mg by mouth daily. For 10 days    . LITHIUM CARBONATE ER 300 MG PO TBCR Oral Take 900 mg by mouth at bedtime.    Marland Kitchen QUETIAPINE FUMARATE 300 MG PO TABS Oral Take 600 mg by mouth at bedtime.    . TRAZODONE HCL 100 MG PO TABS Oral Take 1 tablet (100 mg total) by mouth at bedtime. For sleep. 30 tablet 0    BP 128/77  Pulse 101  Temp 98.4 F (36.9  C) (Oral)  Resp 20  Ht 5\' 4"  (1.626 m)  Wt 131 lb (59.421 kg)  BMI 22.49 kg/m2  SpO2 100%  LMP 05/04/2012  Physical Exam  Constitutional: She is oriented to person, place, and time. She appears well-developed and well-nourished.  HENT:  Head: Normocephalic and atraumatic.       Multiple dental caries  Neck: Neck supple.  Pulmonary/Chest: Effort normal.  Neurological: She is alert and oriented to person, place, and time. No cranial nerve deficit.  Psychiatric: She has a normal mood and affect. Her behavior is normal.    ED Course  Procedures (including critical care time)  Labs Reviewed - No data to display No results found.   No diagnosis found.    MDM  Dental pain, no swelling to indicate infection. She was advised against using narcotic pain medication due to her history of heroin abuse, but she states she has no other choice. Advised to follow up with dentist. Given resources for dental clinics.         Charles B. Bernette Mayers, MD 08/04/12 1129

## 2012-08-04 NOTE — ED Notes (Signed)
Patient states she has had a toothache for over one month.  Was seen here and treated with penicillin which she states is not working.

## 2012-09-25 ENCOUNTER — Encounter (HOSPITAL_BASED_OUTPATIENT_CLINIC_OR_DEPARTMENT_OTHER): Payer: Self-pay

## 2012-09-25 ENCOUNTER — Emergency Department (HOSPITAL_BASED_OUTPATIENT_CLINIC_OR_DEPARTMENT_OTHER)
Admission: EM | Admit: 2012-09-25 | Discharge: 2012-09-25 | Disposition: A | Discharge status: Home / Self Care | Payer: Self-pay | Attending: Emergency Medicine | Admitting: Emergency Medicine

## 2012-09-25 DIAGNOSIS — F319 Bipolar disorder, unspecified: Secondary | ICD-10-CM | POA: Insufficient documentation

## 2012-09-25 DIAGNOSIS — R51 Headache: Secondary | ICD-10-CM | POA: Insufficient documentation

## 2012-09-25 DIAGNOSIS — Z79899 Other long term (current) drug therapy: Secondary | ICD-10-CM | POA: Insufficient documentation

## 2012-09-25 DIAGNOSIS — K0889 Other specified disorders of teeth and supporting structures: Secondary | ICD-10-CM

## 2012-09-25 DIAGNOSIS — F17200 Nicotine dependence, unspecified, uncomplicated: Secondary | ICD-10-CM | POA: Insufficient documentation

## 2012-09-25 DIAGNOSIS — K089 Disorder of teeth and supporting structures, unspecified: Secondary | ICD-10-CM | POA: Insufficient documentation

## 2012-09-25 DIAGNOSIS — IMO0002 Reserved for concepts with insufficient information to code with codable children: Secondary | ICD-10-CM | POA: Insufficient documentation

## 2012-09-25 DIAGNOSIS — J45909 Unspecified asthma, uncomplicated: Secondary | ICD-10-CM | POA: Insufficient documentation

## 2012-09-25 MED ORDER — PENICILLIN V POTASSIUM 500 MG PO TABS: 500.0000 mg | ORAL_TABLET | Freq: Four times a day (QID) | ORAL | Status: AC

## 2012-09-25 MED ORDER — OXYCODONE-ACETAMINOPHEN 5-325 MG PO TABS: 1.0000 | ORAL_TABLET | Freq: Four times a day (QID) | ORAL | Status: DC | PRN

## 2012-09-25 MED ORDER — METRONIDAZOLE 500 MG PO TABS: 500.0000 mg | ORAL_TABLET | Freq: Two times a day (BID) | ORAL | Status: DC

## 2012-09-25 NOTE — ED Notes (Signed)
Pt states that she has broken and painful lower molars states that she has" nerves exposed" concerned one may be abcessed onset one week ago with intermittent pain however past 3 days pain has been constant

## 2012-09-25 NOTE — ED Notes (Addendum)
Previous VS and triage documented on incorrect chart

## 2012-09-25 NOTE — ED Provider Notes (Signed)
History   This chart was scribed for Charles B. Bernette Mayers, MD by Donne Anon, ED Scribe. This patient was seen in room MH07/MH07 and the patient's care was started at 15:10.   CSN: [id number]  Arrival date & time 09/25/12  1510   First MD Initiated Contact with Patient 09/25/12 1541      Chief Complaint  Patient presents with  . Dental Pain     The history is provided by the patient. No language interpreter was used.   Alicia Golden is a 32 y.o. female who presents to the Emergency Department complaining of intermittent dental pain to the lower molar for 1 week. She reports associated headaches. She denies fever, trouble swallowing, trouble breathing, facial swelling or emesis. She has tried ibuprofen and goody powders with no relief. She has a history of degenerative disc disease. She has been seen in the ED previously for the same problem on 08/04/12 and denies following up with a dentist since then. Pt is a current everyday smoker and occasional alcohol user.   Past Medical History  Diagnosis Date  . Asthma   . Bipolar disorder   . Bipolar I disorder, in partial remission 06/14/2012    History reviewed. No pertinent past surgical history.  History reviewed. No pertinent family history.  History  Substance Use Topics  . Smoking status: Current Every Day Smoker -- 2.0 packs/day    Types: Cigarettes  . Smokeless tobacco: Not on file  . Alcohol Use: Yes    OB History    Grav Para Term Preterm Abortions TAB SAB Ect Mult Living                  Review of Systems A complete 10 system review of systems was obtained and all systems are negative except as noted in the HPI and PMH.    Allergies  Vicodin  Home Medications   Current Outpatient Rx  Name  Route  Sig  Dispense  Refill  . BC HEADACHE POWDER PO   Oral   Take by mouth.         Marland Kitchen GABAPENTIN 600 MG PO TABS   Oral   Take 1 tablet (600 mg total) by mouth 3 (three) times daily at 8am, 2pm and bedtime. For  anxiety and mood stabilization.   90 tablet   0   . LAMOTRIGINE 100 MG PO TABS   Oral   Take 50 mg by mouth daily.         Marland Kitchen LAMOTRIGINE 25 MG PO TABS   Oral   Take 25 mg by mouth daily. For 10 days         . LITHIUM CARBONATE ER 300 MG PO TBCR   Oral   Take 900 mg by mouth at bedtime.         . OXYCODONE-ACETAMINOPHEN 5-325 MG PO TABS   Oral   Take 1-2 tablets by mouth every 6 (six) hours as needed for pain.   20 tablet   0   . QUETIAPINE FUMARATE 300 MG PO TABS   Oral   Take 600 mg by mouth at bedtime.         . TRAZODONE HCL 100 MG PO TABS   Oral   Take 1 tablet (100 mg total) by mouth at bedtime. For sleep.   30 tablet   0     Triage Vitals: BP 124/64  Pulse 79  Temp 98.5 F (36.9 C) (Oral)  Resp 18  Ht 5'  3" (1.6 m)  Wt 170 lb (77.111 kg)  BMI 30.11 kg/m2  SpO2 99%  Physical Exam  Nursing note and vitals reviewed. Constitutional: She is oriented to person, place, and time. She appears well-developed and well-nourished.  HENT:  Head: Normocephalic and atraumatic.       Widespread dental disease and large cavity to left second molar.  Eyes: EOM are normal. Pupils are equal, round, and reactive to light.  Neck: Normal range of motion. Neck supple.  Cardiovascular: Normal rate, normal heart sounds and intact distal pulses.   Pulmonary/Chest: Effort normal and breath sounds normal.  Abdominal: Bowel sounds are normal. She exhibits no distension. There is no tenderness.  Musculoskeletal: Normal range of motion. She exhibits no edema and no tenderness.  Neurological: She is alert and oriented to person, place, and time. She has normal strength. No cranial nerve deficit or sensory deficit.  Skin: Skin is warm and dry. No rash noted.  Psychiatric: She has a normal mood and affect.    ED Course  Procedures (including critical care time) DIAGNOSTIC STUDIES: Oxygen Saturation is 99% on room air, normal by my interpretation.    COORDINATION OF  CARE: 3:51 PM Discussed treatment plan which includes pain medication and penicillin with pt at bedside and pt agreed to plan.    Labs Reviewed - No data to display No results found.   No diagnosis found.    MDM  Dental pain, poor dentition, has not been to see a dentist. Only Rx in Controlled Substance Database is from previous ED visit about 6 weeks ago.   I personally performed the services described in this documentation, which was scribed in my presence. The recorded information has been reviewed and is accurate.          Charles B. Bernette Mayers, MD 09/25/12 [phone]

## 2012-12-07 ENCOUNTER — Emergency Department (HOSPITAL_BASED_OUTPATIENT_CLINIC_OR_DEPARTMENT_OTHER)
Admission: EM | Admit: 2012-12-07 | Discharge: 2012-12-07 | Disposition: A | Discharge status: Home / Self Care | Payer: Self-pay | Attending: Emergency Medicine | Admitting: Emergency Medicine

## 2012-12-07 ENCOUNTER — Encounter (HOSPITAL_BASED_OUTPATIENT_CLINIC_OR_DEPARTMENT_OTHER): Payer: Self-pay | Admitting: Emergency Medicine

## 2012-12-07 DIAGNOSIS — M549 Dorsalgia, unspecified: Secondary | ICD-10-CM | POA: Insufficient documentation

## 2012-12-07 DIAGNOSIS — R109 Unspecified abdominal pain: Secondary | ICD-10-CM

## 2012-12-07 DIAGNOSIS — Z3202 Encounter for pregnancy test, result negative: Secondary | ICD-10-CM | POA: Insufficient documentation

## 2012-12-07 DIAGNOSIS — Z79899 Other long term (current) drug therapy: Secondary | ICD-10-CM | POA: Insufficient documentation

## 2012-12-07 DIAGNOSIS — F319 Bipolar disorder, unspecified: Secondary | ICD-10-CM | POA: Insufficient documentation

## 2012-12-07 DIAGNOSIS — J45909 Unspecified asthma, uncomplicated: Secondary | ICD-10-CM | POA: Insufficient documentation

## 2012-12-07 DIAGNOSIS — N898 Other specified noninflammatory disorders of vagina: Secondary | ICD-10-CM | POA: Insufficient documentation

## 2012-12-07 DIAGNOSIS — F17200 Nicotine dependence, unspecified, uncomplicated: Secondary | ICD-10-CM | POA: Insufficient documentation

## 2012-12-07 DIAGNOSIS — R1032 Left lower quadrant pain: Secondary | ICD-10-CM | POA: Insufficient documentation

## 2012-12-07 LAB — WET PREP, GENITAL
Clue Cells Wet Prep HPF POC: NONE SEEN
Trich, Wet Prep: NONE SEEN

## 2012-12-07 LAB — URINALYSIS, ROUTINE W REFLEX MICROSCOPIC
Bilirubin Urine: NEGATIVE
Glucose, UA: NEGATIVE mg/dL
Ketones, ur: NEGATIVE mg/dL
Leukocytes, UA: NEGATIVE
pH: 7 (ref 5.0–8.0)

## 2012-12-07 MED ORDER — OXYCODONE-ACETAMINOPHEN 5-325 MG PO TABS: 2.0000 | ORAL_TABLET | ORAL | Status: DC | PRN

## 2012-12-07 MED ORDER — CYCLOBENZAPRINE HCL 5 MG PO TABS: 5.0000 mg | ORAL_TABLET | Freq: Two times a day (BID) | ORAL | Status: DC | PRN

## 2012-12-07 NOTE — ED Notes (Signed)
Pt. Reports to RN she will need very strong pain meds due to she has a high pain tollerance.  Pt. Reports she has been addicted to Roxicet in past and is no longer in Pain management.

## 2012-12-07 NOTE — ED Provider Notes (Signed)
Medical screening examination/treatment/procedure(s) were performed by non-physician practitioner and as supervising physician I was immediately available for consultation/collaboration.   Carleene Cooper III, MD 12/07/12 2158

## 2012-12-07 NOTE — ED Notes (Signed)
Pt reports that she took pregnancy test on 12/31, feb 4 started bleeding then bleeding stopped, she is unsure if she is still pregnant, no prenantal care , no confirmation of pregnancy, pt developed severe left sided pain today

## 2012-12-07 NOTE — ED Provider Notes (Signed)
History     CSN: [id number]  Arrival date & time 12/07/12  1939   First MD Initiated Contact with Patient 12/07/12 1958      Chief Complaint  Patient presents with  . Abdominal Pain    (Consider location/radiation/quality/duration/timing/severity/associated sxs/prior treatment) HPI Comments: Pt states that she had a positive pregnancy test 1 month ago but she had bleeding about a week ago that lasted about 3 days and she thinks she may have miscarried:pt states that also the way here she started having back pain because she had to push a car in the snow:pt states that she has also had a vaginal discharge  Patient is a 32 y.o. female presenting with abdominal pain. The history is provided by the patient. No language interpreter was used.  Abdominal Pain Pain location:  LLQ Pain quality: aching   Pain radiates to:  Does not radiate Pain severity:  No pain Onset quality:  Gradual Timing:  Constant Progression:  Unchanged Chronicity:  New Context: eating and recent sexual activity   Relieved by:  Nothing Worsened by:  Nothing tried Associated symptoms: no chills, no fever, no nausea and no vomiting     Past Medical History  Diagnosis Date  . Asthma   . Bipolar disorder   . Bipolar I disorder, in partial remission 06/14/2012    History reviewed. No pertinent past surgical history.  History reviewed. No pertinent family history.  History  Substance Use Topics  . Smoking status: Current Every Day Smoker -- 1.00 packs/day    Types: Cigarettes  . Smokeless tobacco: Not on file  . Alcohol Use: Yes    OB History   Grav Para Term Preterm Abortions TAB SAB Ect Mult Living   1               Review of Systems  Constitutional: Negative for fever and chills.  Respiratory: Negative.   Cardiovascular: Negative.   Gastrointestinal: Positive for abdominal pain. Negative for nausea and vomiting.    Allergies  Vicodin  Home Medications   Current Outpatient Rx  Name   Route  Sig  Dispense  Refill  . oxyCODONE-acetaminophen (PERCOCET/ROXICET) 5-325 MG per tablet   Oral   Take 1-2 tablets by mouth every 6 (six) hours as needed for pain.   20 tablet   0   . Aspirin-Salicylamide-Caffeine (BC HEADACHE POWDER PO)   Oral   Take by mouth.         . gabapentin (NEURONTIN) 600 MG tablet   Oral   Take 1 tablet (600 mg total) by mouth 3 (three) times daily at 8am, 2pm and bedtime. For anxiety and mood stabilization.   90 tablet   0   . lamoTRIgine (LAMICTAL) 100 MG tablet   Oral   Take 50 mg by mouth daily.         Marland Kitchen EXPIRED: lamoTRIgine (LAMICTAL) 25 MG tablet   Oral   Take 25 mg by mouth daily. For 10 days         . lithium carbonate (LITHOBID) 300 MG CR tablet   Oral   Take 900 mg by mouth at bedtime.         . metroNIDAZOLE (FLAGYL) 500 MG tablet   Oral   Take 1 tablet (500 mg total) by mouth 2 (two) times daily.   14 tablet   0   . oxyCODONE-acetaminophen (PERCOCET/ROXICET) 5-325 MG per tablet   Oral   Take 1-2 tablets by mouth every 6 (six) hours  as needed for pain.   20 tablet   0   . QUEtiapine (SEROQUEL) 300 MG tablet   Oral   Take 600 mg by mouth at bedtime.         Marland Kitchen EXPIRED: traZODone (DESYREL) 100 MG tablet   Oral   Take 1 tablet (100 mg total) by mouth at bedtime. For sleep.   30 tablet   0     BP 131/76  Pulse 63  Temp(Src) 98.6 F (37 C) (Oral)  Ht 5\' 4"  (1.626 m)  Wt 125 lb (56.7 kg)  BMI 21.45 kg/m2  SpO2 94%  LMP 09/23/2012  Physical Exam  Nursing note and vitals reviewed. Constitutional: She is oriented to person, place, and time. She appears well-developed and well-nourished.  HENT:  Head: Atraumatic.  Cardiovascular: Normal rate and regular rhythm.   Pulmonary/Chest: Effort normal and breath sounds normal.  Abdominal: Soft. Bowel sounds are normal.  Genitourinary:  White vaginal discharge  Musculoskeletal: Normal range of motion.  Neurological: She is alert and oriented to person, place,  and time.  Skin: Skin is warm and dry.    ED Course  Procedures (including critical care time)  Labs Reviewed  WET PREP, GENITAL - Abnormal; Notable for the following:    WBC, Wet Prep HPF POC FEW (*)    All other components within normal limits  GC/CHLAMYDIA PROBE AMP  URINALYSIS, ROUTINE W REFLEX MICROSCOPIC  PREGNANCY, URINE  RPR   No results found.   1. Abdominal pain   2. Back pain       MDM  Pts abdomen not acute:pt not having any neuro deficits:will treat symptomatically and give referral to Dr. Vivi Barrack, NP 12/07/12 2052

## 2012-12-08 LAB — RPR: RPR Ser Ql: NONREACTIVE

## 2012-12-08 LAB — GC/CHLAMYDIA PROBE AMP
CT Probe RNA: NEGATIVE
GC Probe RNA: NEGATIVE

## 2013-10-28 ENCOUNTER — Encounter (HOSPITAL_BASED_OUTPATIENT_CLINIC_OR_DEPARTMENT_OTHER): Payer: Self-pay | Admitting: Emergency Medicine

## 2013-10-28 ENCOUNTER — Emergency Department (HOSPITAL_BASED_OUTPATIENT_CLINIC_OR_DEPARTMENT_OTHER)
Admission: EM | Admit: 2013-10-28 | Discharge: 2013-10-28 | Disposition: A | Discharge status: Home / Self Care | Payer: Self-pay | Attending: Emergency Medicine | Admitting: Emergency Medicine

## 2013-10-28 DIAGNOSIS — Z79899 Other long term (current) drug therapy: Secondary | ICD-10-CM | POA: Insufficient documentation

## 2013-10-28 DIAGNOSIS — J45909 Unspecified asthma, uncomplicated: Secondary | ICD-10-CM | POA: Insufficient documentation

## 2013-10-28 DIAGNOSIS — K047 Periapical abscess without sinus: Secondary | ICD-10-CM | POA: Insufficient documentation

## 2013-10-28 DIAGNOSIS — K002 Abnormalities of size and form of teeth: Secondary | ICD-10-CM | POA: Insufficient documentation

## 2013-10-28 DIAGNOSIS — IMO0002 Reserved for concepts with insufficient information to code with codable children: Secondary | ICD-10-CM | POA: Insufficient documentation

## 2013-10-28 DIAGNOSIS — J029 Acute pharyngitis, unspecified: Secondary | ICD-10-CM | POA: Insufficient documentation

## 2013-10-28 DIAGNOSIS — F17200 Nicotine dependence, unspecified, uncomplicated: Secondary | ICD-10-CM | POA: Insufficient documentation

## 2013-10-28 DIAGNOSIS — F3175 Bipolar disorder, in partial remission, most recent episode depressed: Secondary | ICD-10-CM | POA: Insufficient documentation

## 2013-10-28 MED ORDER — CEPHALEXIN 500 MG PO CAPS: 500.0000 mg | ORAL_CAPSULE | Freq: Two times a day (BID) | ORAL | Status: DC

## 2013-10-28 MED ORDER — CEPHALEXIN 250 MG PO CAPS
ORAL_CAPSULE | ORAL | Status: AC
  Filled 2013-10-28: qty 1

## 2013-10-28 MED ORDER — CEPHALEXIN 250 MG PO CAPS
500.0000 mg | ORAL_CAPSULE | Freq: Once | ORAL | Status: AC
  Administered 2013-10-28: 500 mg via ORAL
  Filled 2013-10-28: qty 2

## 2013-10-28 NOTE — ED Provider Notes (Signed)
CSN: [id number]     Arrival date & time 10/28/13  2107 History  This chart was scribed for Carmin Muskrat, MD by Ludger Nutting, ED Scribe. This patient was seen in room MH06/MH06 and the patient's care was started 10:10 PM.    Chief Complaint  Patient presents with  . Recurrent Skin Infections    The history is provided by the patient. No language interpreter was used.    HPI Comments: Alicia Golden is a 33 y.o. female who presents to the Emergency Department complaining of 1 week of gradual onset, constant, gradually improving abscess to the right cheek. She states the area initially was painful and developed redness and swelling about 2 days ago. She reports taking 2 doses Keflex from a relative's leftover prescription. She reports having a mild sore throat recently. She denies any other symptoms at this time. Pt is a current everyday smoker and occasional alcohol user.   Past Medical History  Diagnosis Date  . Asthma   . Bipolar disorder   . Bipolar I disorder, in partial remission 06/14/2012   History reviewed. No pertinent past surgical history. No family history on file. History  Substance Use Topics  . Smoking status: Current Every Day Smoker -- 1.00 packs/day    Types: Cigarettes  . Smokeless tobacco: Not on file  . Alcohol Use: Yes   OB History   Grav Para Term Preterm Abortions TAB SAB Ect Mult Living   1              Review of Systems  Constitutional:       Per HPI, otherwise negative  HENT: Positive for sore throat.        Per HPI, otherwise negative  Respiratory:       Per HPI, otherwise negative  Cardiovascular:       Per HPI, otherwise negative  Gastrointestinal: Negative for vomiting.  Endocrine:       Negative aside from HPI  Genitourinary:       Neg aside from HPI   Musculoskeletal:       Per HPI, otherwise negative  Skin: Positive for wound (abscess).  Neurological: Negative for syncope.    Allergies  Vicodin  Home Medications   Current  Outpatient Rx  Name  Route  Sig  Dispense  Refill  . Aspirin-Salicylamide-Caffeine (BC HEADACHE POWDER PO)   Oral   Take by mouth.         . cyclobenzaprine (FLEXERIL) 5 MG tablet   Oral   Take 1 tablet (5 mg total) by mouth 2 (two) times daily as needed for muscle spasms.   10 tablet   0   . EXPIRED: gabapentin (NEURONTIN) 600 MG tablet   Oral   Take 1 tablet (600 mg total) by mouth 3 (three) times daily at 8am, 2pm and bedtime. For anxiety and mood stabilization.   90 tablet   0   . lamoTRIgine (LAMICTAL) 100 MG tablet   Oral   Take 50 mg by mouth daily.         Marland Kitchen EXPIRED: lamoTRIgine (LAMICTAL) 25 MG tablet   Oral   Take 25 mg by mouth daily. For 10 days         . lithium carbonate (LITHOBID) 300 MG CR tablet   Oral   Take 900 mg by mouth at bedtime.         . metroNIDAZOLE (FLAGYL) 500 MG tablet   Oral   Take 1 tablet (500 mg total) by  mouth 2 (two) times daily.   14 tablet   0   . oxyCODONE-acetaminophen (PERCOCET/ROXICET) 5-325 MG per tablet   Oral   Take 1-2 tablets by mouth every 6 (six) hours as needed for pain.   20 tablet   0   . oxyCODONE-acetaminophen (PERCOCET/ROXICET) 5-325 MG per tablet   Oral   Take 1-2 tablets by mouth every 6 (six) hours as needed for pain.   20 tablet   0   . oxyCODONE-acetaminophen (PERCOCET/ROXICET) 5-325 MG per tablet   Oral   Take 2 tablets by mouth every 4 (four) hours as needed for pain.   6 tablet   0   . QUEtiapine (SEROQUEL) 300 MG tablet   Oral   Take 600 mg by mouth at bedtime.         Marland Kitchen EXPIRED: traZODone (DESYREL) 100 MG tablet   Oral   Take 1 tablet (100 mg total) by mouth at bedtime. For sleep.   30 tablet   0    BP 124/89  Pulse 96  Temp(Src) 98.3 F (36.8 C) (Oral)  Resp 14  Ht 5\' 6"  (1.676 m)  Wt 135 lb (61.236 kg)  BMI 21.80 kg/m2  SpO2 100%  LMP 09/23/2012 Physical Exam  Nursing note and vitals reviewed. Constitutional: She is oriented to person, place, and time. She appears  well-developed and well-nourished. No distress.  HENT:  Head: Normocephalic and atraumatic.  Mouth/Throat: Abnormal dentition.  No TMJ tenderness. Poor dentition. Erythematous area on right maxillary prominence consistent with peri apical abscess.   Eyes: Conjunctivae and EOM are normal.  Cardiovascular: Normal rate, regular rhythm and normal heart sounds.   Pulmonary/Chest: Effort normal and breath sounds normal. No stridor. No respiratory distress. She has no wheezes. She has no rales.  Abdominal: She exhibits no distension.  Musculoskeletal: She exhibits no edema.  Neurological: She is alert and oriented to person, place, and time. No cranial nerve deficit.  Skin: Skin is warm and dry.  Psychiatric: She has a normal mood and affect.    ED Course  Procedures (including critical care time)  DIAGNOSTIC STUDIES: Oxygen Saturation is 100% on RA, normal by my interpretation.    COORDINATION OF CARE: 10:15 PM Discussed treatment plan with pt at bedside and pt agreed to plan.   Labs Review Labs Reviewed - No data to display Imaging Review No results found.  EKG Interpretation   None       MDM   I personally performed the services described in this documentation, which was scribed in my presence. The recorded information has been reviewed and is accurate.   Patient presents in no distress, afebrile, hemodynamically stable with a right facial wound.  Given the patient's poor dentition there is concern for periapical abscess.  Given her description of improvement following a brief course of antibiotics, this was continued.  Absent any distress upper discharge with outpatient management.  She was discharged with a resource a list to insure that this occurred.  Carmin Muskrat, MD 10/28/13 2237

## 2013-10-28 NOTE — ED Notes (Signed)
rx x 1 given for keflex. Resource guide given for f/u

## 2013-10-28 NOTE — Discharge Instructions (Signed)
As discussed, it is important to follow up with her primary care physician and a dentist for further evaluation and management of your condition.  Please use the provided resources to attempt to make an appointment this week. Physical medication as directed, and return here for concerning changes in your condition

## 2013-10-28 NOTE — ED Notes (Signed)
Patient with abscess to her right side of her face.    Patient noticed noticed the abscess about two days ago.

## 2013-11-04 ENCOUNTER — Emergency Department (HOSPITAL_BASED_OUTPATIENT_CLINIC_OR_DEPARTMENT_OTHER)
Admission: EM | Admit: 2013-11-04 | Discharge: 2013-11-04 | Disposition: A | Discharge status: Home / Self Care | Payer: Self-pay | Attending: Emergency Medicine | Admitting: Emergency Medicine

## 2013-11-04 ENCOUNTER — Encounter (HOSPITAL_BASED_OUTPATIENT_CLINIC_OR_DEPARTMENT_OTHER): Payer: Self-pay | Admitting: Emergency Medicine

## 2013-11-04 DIAGNOSIS — Z8619 Personal history of other infectious and parasitic diseases: Secondary | ICD-10-CM | POA: Insufficient documentation

## 2013-11-04 DIAGNOSIS — Z872 Personal history of diseases of the skin and subcutaneous tissue: Secondary | ICD-10-CM | POA: Insufficient documentation

## 2013-11-04 DIAGNOSIS — Z79899 Other long term (current) drug therapy: Secondary | ICD-10-CM | POA: Insufficient documentation

## 2013-11-04 DIAGNOSIS — J029 Acute pharyngitis, unspecified: Secondary | ICD-10-CM

## 2013-11-04 DIAGNOSIS — F4481 Dissociative identity disorder: Secondary | ICD-10-CM | POA: Insufficient documentation

## 2013-11-04 DIAGNOSIS — F410 Panic disorder [episodic paroxysmal anxiety] without agoraphobia: Secondary | ICD-10-CM | POA: Insufficient documentation

## 2013-11-04 DIAGNOSIS — Z8541 Personal history of malignant neoplasm of cervix uteri: Secondary | ICD-10-CM | POA: Insufficient documentation

## 2013-11-04 DIAGNOSIS — IMO0002 Reserved for concepts with insufficient information to code with codable children: Secondary | ICD-10-CM | POA: Insufficient documentation

## 2013-11-04 DIAGNOSIS — J45909 Unspecified asthma, uncomplicated: Secondary | ICD-10-CM | POA: Insufficient documentation

## 2013-11-04 DIAGNOSIS — F17200 Nicotine dependence, unspecified, uncomplicated: Secondary | ICD-10-CM | POA: Insufficient documentation

## 2013-11-04 DIAGNOSIS — F319 Bipolar disorder, unspecified: Secondary | ICD-10-CM | POA: Insufficient documentation

## 2013-11-04 HISTORY — DX: Dissociative identity disorder: F44.81

## 2013-11-04 HISTORY — DX: Malignant (primary) neoplasm, unspecified: C80.1

## 2013-11-04 HISTORY — DX: Panic disorder (episodic paroxysmal anxiety): F41.0

## 2013-11-04 HISTORY — DX: Malignant neoplasm of cervix uteri, unspecified: C53.9

## 2013-11-04 HISTORY — DX: Dermatitis, unspecified: L30.9

## 2013-11-04 HISTORY — DX: Unspecified viral hepatitis C without hepatic coma: B19.20

## 2013-11-04 LAB — RAPID STREP SCREEN (MED CTR MEBANE ONLY): STREPTOCOCCUS, GROUP A SCREEN (DIRECT): NEGATIVE

## 2013-11-04 MED ORDER — PREDNISONE 20 MG PO TABS: ORAL_TABLET | ORAL | Status: DC

## 2013-11-04 MED ORDER — CEPHALEXIN 500 MG PO CAPS: ORAL_CAPSULE | ORAL | Status: DC

## 2013-11-04 MED ORDER — FLUCONAZOLE 150 MG PO TABS: 150.0000 mg | ORAL_TABLET | Freq: Once | ORAL | Status: DC

## 2013-11-04 MED ORDER — HYDROMORPHONE HCL 4 MG PO TABS: 4.0000 mg | ORAL_TABLET | ORAL | Status: DC | PRN

## 2013-11-04 NOTE — ED Provider Notes (Signed)
CSN: [id number]     Arrival date & time 11/04/13  1523 History   First MD Initiated Contact with Patient 11/04/13 1728     Chief Complaint  Patient presents with  . Sore Throat   (Consider location/radiation/quality/duration/timing/severity/associated sxs/prior Treatment) HPI 33 year old female one week sore throat fever body aches no cough no nasal congestion never filled her prior prescription for Keflex when she had an abscess which is not her shoe today she is no shortness breath no wheezing no chest pain no abdominal pain no vomiting no diarrhea no rash no confusion; she does have a moderate gradual onset headache without confusion without focal weakness or numbness without stiff neck. There is no treatment prior to arrival. Past Medical History  Diagnosis Date  . Asthma   . Bipolar disorder   . Bipolar I disorder, in partial remission 06/14/2012  . Eczema   . Multiple personality disorder   . Cancer   . Cervical cancer   . Panic attacks   . Hepatitis C    History reviewed. No pertinent past surgical history. No family history on file. History  Substance Use Topics  . Smoking status: Current Every Day Smoker -- 1.00 packs/day    Types: Cigarettes  . Smokeless tobacco: Not on file  . Alcohol Use: Yes     Comment: occasional   OB History   Grav Para Term Preterm Abortions TAB SAB Ect Mult Living   1              Review of Systems 10 Systems reviewed and are negative for acute change except as noted in the HPI. Allergies  Vicodin  Home Medications   Current Outpatient Rx  Name  Route  Sig  Dispense  Refill  . Aspirin-Salicylamide-Caffeine (BC HEADACHE POWDER PO)   Oral   Take by mouth.         . cyclobenzaprine (FLEXERIL) 5 MG tablet   Oral   Take 1 tablet (5 mg total) by mouth 2 (two) times daily as needed for muscle spasms.   10 tablet   0   . EXPIRED: gabapentin (NEURONTIN) 600 MG tablet   Oral   Take 1 tablet (600 mg total) by mouth 3 (three) times  daily at 8am, 2pm and bedtime. For anxiety and mood stabilization.   90 tablet   0   . oxyCODONE-acetaminophen (PERCOCET/ROXICET) 5-325 MG per tablet   Oral   Take 2 tablets by mouth every 4 (four) hours as needed for pain.   6 tablet   0   . cephALEXin (KEFLEX) 500 MG capsule   Oral   Take 1 capsule (500 mg total) by mouth 2 (two) times daily.   14 capsule   0   . cephALEXin (KEFLEX) 500 MG capsule      2 caps po bid x 7 days   28 capsule   0   . fluconazole (DIFLUCAN) 150 MG tablet   Oral   Take 1 tablet (150 mg total) by mouth once. Repeat in one week prn.   2 tablet   0   . HYDROmorphone (DILAUDID) 4 MG tablet   Oral   Take 1 tablet (4 mg total) by mouth every 4 (four) hours as needed for severe pain.   10 tablet   0   . lamoTRIgine (LAMICTAL) 100 MG tablet   Oral   Take 50 mg by mouth daily.         Marland Kitchen EXPIRED: lamoTRIgine (LAMICTAL) 25 MG tablet  Oral   Take 25 mg by mouth daily. For 10 days         . lithium carbonate (LITHOBID) 300 MG CR tablet   Oral   Take 900 mg by mouth at bedtime.         . metroNIDAZOLE (FLAGYL) 500 MG tablet   Oral   Take 1 tablet (500 mg total) by mouth 2 (two) times daily.   14 tablet   0   . oxyCODONE-acetaminophen (PERCOCET/ROXICET) 5-325 MG per tablet   Oral   Take 1-2 tablets by mouth every 6 (six) hours as needed for pain.   20 tablet   0   . oxyCODONE-acetaminophen (PERCOCET/ROXICET) 5-325 MG per tablet   Oral   Take 1-2 tablets by mouth every 6 (six) hours as needed for pain.   20 tablet   0   . predniSONE (DELTASONE) 20 MG tablet      3 tabs po daily x 2 days   6 tablet   0   . QUEtiapine (SEROQUEL) 300 MG tablet   Oral   Take 600 mg by mouth at bedtime.         Marland Kitchen EXPIRED: traZODone (DESYREL) 100 MG tablet   Oral   Take 1 tablet (100 mg total) by mouth at bedtime. For sleep.   30 tablet   0    BP 100/60  Pulse 114  Temp(Src) 100.1 F (37.8 C) (Oral)  Resp 20  Ht 5\' 4"  (1.626 m)  Wt  138 lb (62.596 kg)  BMI 23.68 kg/m2  SpO2 99%  LMP 01/23/2013  Breastfeeding? Unknown Physical Exam  Nursing note and vitals reviewed. Constitutional:  Awake, alert, nontoxic appearance.  HENT:  Head: Atraumatic.  Mouth/Throat: Oropharyngeal exudate present.  Bilateral exudative tonsillitis with midline uvula no stridor no drooling no trismus and voice is normal  Eyes: Conjunctivae are normal. Right eye exhibits no discharge. Left eye exhibits no discharge.  Neck: Neck supple.  Cardiovascular: Regular rhythm.   No murmur heard. Tachycardic  Pulmonary/Chest: Effort normal and breath sounds normal. No stridor. No respiratory distress. She has no wheezes. She has no rales. She exhibits no tenderness.  Abdominal: Soft. Bowel sounds are normal. She exhibits no distension and no mass. There is no tenderness. There is no rebound and no guarding.  No hepatosplenomegaly palpated  Musculoskeletal: She exhibits no edema and no tenderness.  Baseline ROM, no obvious new focal weakness.  Lymphadenopathy:    She has cervical adenopathy.  Neurological: She is alert.  Mental status and motor strength appears baseline for patient and situation.  Skin: No rash noted.  Psychiatric: She has a normal mood and affect.    ED Course  Procedures (including critical care time) Patient / Family / Caregiver informed of clinical course, understand medical decision-making process, and agree with plan. Labs Review Labs Reviewed  RAPID STREP SCREEN  CULTURE, GROUP A STREP   Imaging Review No results found.  EKG Interpretation   None       MDM   1. Exudative pharyngitis    I doubt any other EMC precluding discharge at this time including, but not necessarily limited to the following:PTA.    Babette Relic, MD 11/05/13 2139

## 2013-11-04 NOTE — ED Notes (Signed)
C/o fever and sore throat x 1 week

## 2013-11-07 LAB — CULTURE, GROUP A STREP

## 2014-08-26 ENCOUNTER — Encounter (HOSPITAL_BASED_OUTPATIENT_CLINIC_OR_DEPARTMENT_OTHER): Payer: Self-pay | Admitting: Emergency Medicine

## 2016-01-14 ENCOUNTER — Emergency Department (HOSPITAL_BASED_OUTPATIENT_CLINIC_OR_DEPARTMENT_OTHER)
Admission: EM | Admit: 2016-01-14 | Discharge: 2016-01-14 | Disposition: A | Discharge status: Home / Self Care | Payer: Self-pay | Attending: Emergency Medicine | Admitting: Emergency Medicine

## 2016-01-14 ENCOUNTER — Encounter (HOSPITAL_BASED_OUTPATIENT_CLINIC_OR_DEPARTMENT_OTHER): Payer: Self-pay | Admitting: Emergency Medicine

## 2016-01-14 ENCOUNTER — Emergency Department (HOSPITAL_BASED_OUTPATIENT_CLINIC_OR_DEPARTMENT_OTHER): Payer: Self-pay

## 2016-01-14 DIAGNOSIS — M79604 Pain in right leg: Secondary | ICD-10-CM | POA: Insufficient documentation

## 2016-01-14 DIAGNOSIS — Z872 Personal history of diseases of the skin and subcutaneous tissue: Secondary | ICD-10-CM | POA: Insufficient documentation

## 2016-01-14 DIAGNOSIS — Z79899 Other long term (current) drug therapy: Secondary | ICD-10-CM | POA: Insufficient documentation

## 2016-01-14 DIAGNOSIS — N760 Acute vaginitis: Secondary | ICD-10-CM | POA: Insufficient documentation

## 2016-01-14 DIAGNOSIS — Z8541 Personal history of malignant neoplasm of cervix uteri: Secondary | ICD-10-CM | POA: Insufficient documentation

## 2016-01-14 DIAGNOSIS — F17210 Nicotine dependence, cigarettes, uncomplicated: Secondary | ICD-10-CM | POA: Insufficient documentation

## 2016-01-14 DIAGNOSIS — F410 Panic disorder [episodic paroxysmal anxiety] without agoraphobia: Secondary | ICD-10-CM | POA: Insufficient documentation

## 2016-01-14 DIAGNOSIS — Z7982 Long term (current) use of aspirin: Secondary | ICD-10-CM | POA: Insufficient documentation

## 2016-01-14 DIAGNOSIS — Z8619 Personal history of other infectious and parasitic diseases: Secondary | ICD-10-CM | POA: Insufficient documentation

## 2016-01-14 DIAGNOSIS — Z792 Long term (current) use of antibiotics: Secondary | ICD-10-CM | POA: Insufficient documentation

## 2016-01-14 DIAGNOSIS — B9689 Other specified bacterial agents as the cause of diseases classified elsewhere: Secondary | ICD-10-CM

## 2016-01-14 DIAGNOSIS — J45909 Unspecified asthma, uncomplicated: Secondary | ICD-10-CM | POA: Insufficient documentation

## 2016-01-14 DIAGNOSIS — F319 Bipolar disorder, unspecified: Secondary | ICD-10-CM | POA: Insufficient documentation

## 2016-01-14 HISTORY — DX: Other psychoactive substance abuse, uncomplicated: F19.10

## 2016-01-14 LAB — CBC WITH DIFFERENTIAL/PLATELET
BASOS ABS: 0 10*3/uL (ref 0.0–0.1)
Basophils Relative: 0 %
EOS PCT: 1 %
Eosinophils Absolute: 0.1 10*3/uL (ref 0.0–0.7)
HCT: 39.1 % (ref 36.0–46.0)
Hemoglobin: 13.5 g/dL (ref 12.0–15.0)
LYMPHS ABS: 2.4 10*3/uL (ref 0.7–4.0)
Lymphocytes Relative: 30 %
MCH: 29.9 pg (ref 26.0–34.0)
MCHC: 34.5 g/dL (ref 30.0–36.0)
MCV: 86.5 fL (ref 78.0–100.0)
Monocytes Absolute: 0.7 10*3/uL (ref 0.1–1.0)
Monocytes Relative: 8 %
Neutro Abs: 5.1 10*3/uL (ref 1.7–7.7)
Neutrophils Relative %: 61 %
Platelets: 145 10*3/uL — ABNORMAL LOW (ref 150–400)
RBC: 4.52 MIL/uL (ref 3.87–5.11)
RDW: 12.2 % (ref 11.5–15.5)
WBC: 8.3 10*3/uL (ref 4.0–10.5)

## 2016-01-14 LAB — BASIC METABOLIC PANEL
Anion gap: 9 (ref 5–15)
BUN: 6 mg/dL (ref 6–20)
CO2: 27 mmol/L (ref 22–32)
Calcium: 9.1 mg/dL (ref 8.9–10.3)
Chloride: 100 mmol/L — ABNORMAL LOW (ref 101–111)
Creatinine, Ser: 0.74 mg/dL (ref 0.44–1.00)
GFR calc Af Amer: >60 mL/min (ref >60)
Glucose, Bld: 81 mg/dL (ref 65–99)
Potassium: 4 mmol/L (ref 3.5–5.1)
Sodium: 136 mmol/L (ref 135–145)

## 2016-01-14 LAB — URINALYSIS, ROUTINE W REFLEX MICROSCOPIC
BILIRUBIN URINE: NEGATIVE
Glucose, UA: NEGATIVE mg/dL
HGB URINE DIPSTICK: NEGATIVE
Ketones, ur: NEGATIVE mg/dL
Leukocytes, UA: NEGATIVE
Nitrite: NEGATIVE
PROTEIN: NEGATIVE mg/dL
Specific Gravity, Urine: 1.015 (ref 1.005–1.030)
pH: 7.5 (ref 5.0–8.0)

## 2016-01-14 LAB — WET PREP, GENITAL
Sperm: NONE SEEN
Trich, Wet Prep: NONE SEEN
YEAST WET PREP: NONE SEEN

## 2016-01-14 MED ORDER — FLUCONAZOLE 150 MG PO TABS: 150.0000 mg | ORAL_TABLET | Freq: Once | ORAL | Status: AC

## 2016-01-14 MED ORDER — METRONIDAZOLE 500 MG PO TABS: 500.0000 mg | ORAL_TABLET | Freq: Two times a day (BID) | ORAL | Status: DC

## 2016-01-14 NOTE — ED Notes (Signed)
Patient transported to Ultrasound 

## 2016-01-14 NOTE — ED Notes (Signed)
Pt verbalizes understanding of d/c instructions and denies any further needs at this time. 

## 2016-01-14 NOTE — Discharge Instructions (Signed)
Use tylenol or motrin for your leg pain. Your test results were positive for bacterial vaginosis. You will need to take flagyl twice a day for one week. Schedule a follow up appointment with your PCP if your leg pain does not improve. Return to ED with new, worsening or concerning symptoms.    Bacterial Vaginosis Bacterial vaginosis is a vaginal infection that occurs when the normal balance of bacteria in the vagina is disrupted. It results from an overgrowth of certain bacteria. This is the most common vaginal infection in women of childbearing age. Treatment is important to prevent complications, especially in pregnant women, as it can cause a premature delivery. CAUSES  Bacterial vaginosis is caused by an increase in harmful bacteria that are normally present in smaller amounts in the vagina. Several different kinds of bacteria can cause bacterial vaginosis. However, the reason that the condition develops is not fully understood. RISK FACTORS Certain activities or behaviors can put you at an increased risk of developing bacterial vaginosis, including:  Having a new sex partner or multiple sex partners.  Douching.  Using an intrauterine device (IUD) for contraception. Women do not get bacterial vaginosis from toilet seats, bedding, swimming pools, or contact with objects around them. SIGNS AND SYMPTOMS  Some women with bacterial vaginosis have no signs or symptoms. Common symptoms include:  Grey vaginal discharge.  A fishlike odor with discharge, especially after sexual intercourse.  Itching or burning of the vagina and vulva.  Burning or pain with urination. DIAGNOSIS  Your health care provider will take a medical history and examine the vagina for signs of bacterial vaginosis. A sample of vaginal fluid may be taken. Your health care provider will look at this sample under a microscope to check for bacteria and abnormal cells. A vaginal pH test may also be done.  TREATMENT  Bacterial  vaginosis may be treated with antibiotic medicines. These may be given in the form of a pill or a vaginal cream. A second round of antibiotics may be prescribed if the condition comes back after treatment. Because bacterial vaginosis increases your risk for sexually transmitted diseases, getting treated can help reduce your risk for chlamydia, gonorrhea, HIV, and herpes. HOME CARE INSTRUCTIONS   Only take over-the-counter or prescription medicines as directed by your health care provider.  If antibiotic medicine was prescribed, take it as directed. Make sure you finish it even if you start to feel better.  Tell all sexual partners that you have a vaginal infection. They should see their health care provider and be treated if they have problems, such as a mild rash or itching.  During treatment, it is important that you follow these instructions:  Avoid sexual activity or use condoms correctly.  Do not douche.  Avoid alcohol as directed by your health care provider.  Avoid breastfeeding as directed by your health care provider. SEEK MEDICAL CARE IF:   Your symptoms are not improving after 3 days of treatment.  You have increased discharge or pain.  You have a fever. MAKE SURE YOU:   Understand these instructions.  Will watch your condition.  Will get help right away if you are not doing well or get worse. FOR MORE INFORMATION  Centers for Disease Control and Prevention, Division of STD Prevention: [employer]raud[employer] American Sexual Health Association (ASHA): www.ashastd.org    This information is not intended to replace advice given to you by your health care provider. Make sure you discuss any questions you have with your health care provider.  Document Released: 10/11/2005 Document Revised: 11/01/2014 Document Reviewed: 05/23/2013 Elsevier Interactive Patient Education 2016 [employer]  Musculoskeletal Pain Musculoskeletal pain is muscle and boney aches and pains. These  pains can occur in any part of the body. Your caregiver may treat you without knowing the cause of the pain. They may treat you if blood or urine tests, X-rays, and other tests were normal.  CAUSES There is often not a definite cause or reason for these pains. These pains may be caused by a type of germ (virus). The discomfort may also come from overuse. Overuse includes working out too hard when your body is not fit. Boney aches also come from weather changes. Bone is sensitive to atmospheric pressure changes. HOME CARE INSTRUCTIONS   Ask when your test results will be ready. Make sure you get your test results.  Only take over-the-counter or prescription medicines for pain, discomfort, or fever as directed by your caregiver. If you were given medications for your condition, do not drive, operate machinery or power tools, or sign legal documents for 24 hours. Do not drink alcohol. Do not take sleeping pills or other medications that may interfere with treatment.  Continue all activities unless the activities cause more pain. When the pain lessens, slowly resume normal activities. Gradually increase the intensity and duration of the activities or exercise.  During periods of severe pain, bed rest may be helpful. Lay or sit in any position that is comfortable.  Putting ice on the injured area.  Put ice in a bag.  Place a towel between your skin and the bag.  Leave the ice on for 15 to 20 minutes, 3 to 4 times a day.  Follow up with your caregiver for continued problems and no reason can be found for the pain. If the pain becomes worse or does not go away, it may be necessary to repeat tests or do additional testing. Your caregiver may need to look further for a possible cause. SEEK IMMEDIATE MEDICAL CARE IF:  You have pain that is getting worse and is not relieved by medications.  You develop chest pain that is associated with shortness or breath, sweating, feeling sick to your stomach  (nauseous), or throw up (vomit).  Your pain becomes localized to the abdomen.  You develop any new symptoms that seem different or that concern you. MAKE SURE YOU:   Understand these instructions.  Will watch your condition.  Will get help right away if you are not doing well or get worse.   This information is not intended to replace advice given to you by your health care provider. Make sure you discuss any questions you have with your health care provider.   Document Released: 10/11/2005 Document Revised: 01/03/2012 Document Reviewed: 06/15/2013 Elsevier Interactive Patient Education [employer].

## 2016-01-14 NOTE — ED Notes (Signed)
Pelvic cart at bedside. 

## 2016-01-14 NOTE — ED Provider Notes (Signed)
CSN: [id number]     Arrival date & time 01/14/16  1431 History   First MD Initiated Contact with Patient 01/14/16 1655     Chief Complaint  Patient presents with  . Leg Pain   HPI  Alicia Golden is a 35 year old female with PMHx of bipolar disorder, multiple personality disorder, hepatitis C and IVDU presenting with right leg pain. She states that two weeks ago she had a pulled muscle in the right inner thigh that felt like a burning pain. This has since resolved and now she is having aching pain in the posterior-medial right calf. She states she can feel a knot at the posterior calf. This pain has been present for 4 days. She describes it as an aching pain that radiates to the anterior ankle. She also notes that when she woke this morning, her right foot looked swollen and black. She states this has since improved but still concerns her. Her ankle pain is exacerbated by plantarflexion. She also notes feeling a sharp pain in the dorsal foot but states this occurred when she stepped onto a metal doorframe. The dorsal foot pain has not recurred. Denies recent trauma to the right leg. She does admit to IVDU. She has injected into her feet before; most recently 2 months ago. She last injected yesterday but this was into an upper extremity. She also notes vaginal odor and green-yellow discharge x 1 year. She reports being incompletely treated for BV approximately 1 year ago and has had odor and discharge since. Denies concern for STDs. Denies abdominal pain, nausea, vomiting, dysuria, hematuria, flank pain or fevers. Denies a history of DVTs, recent long travel or hormonal therapy.   Past Medical History  Diagnosis Date  . Asthma   . Bipolar disorder ([place])   . Bipolar I disorder, in partial remission ([place]) 06/14/2012  . Eczema   . Multiple personality disorder   . Cancer ([place])   . Cervical cancer ([place])   . Panic attacks   . Hepatitis C   . Substance abuse     heroin   History reviewed. No pertinent  past surgical history. History reviewed. No pertinent family history. Social History  Substance Use Topics  . Smoking status: Current Every Day Smoker -- 1.00 packs/day    Types: Cigarettes  . Smokeless tobacco: None  . Alcohol Use: Yes     Comment: occasional   OB History    Gravida Para Term Preterm AB TAB SAB Ectopic Multiple Living   1              Review of Systems  All other systems reviewed and are negative.     Allergies  Vicodin  Home Medications   Prior to Admission medications   Medication Sig Start Date End Date Taking? Authorizing Provider  methadone (DOLOPHINE) 10 MG tablet Take 130 mg by mouth daily.   Yes Historical Provider, MD  Aspirin-Salicylamide-Caffeine (BC HEADACHE POWDER PO) Take by mouth.    Historical Provider, MD  cephALEXin (KEFLEX) 500 MG capsule Take 1 capsule (500 mg total) by mouth 2 (two) times daily. 10/28/13   Carmin Muskrat, MD  cephALEXin (KEFLEX) 500 MG capsule 2 caps po bid x 7 days 11/04/13   Riki Altes, MD  cyclobenzaprine (FLEXERIL) 5 MG tablet Take 1 tablet (5 mg total) by mouth 2 (two) times daily as needed for muscle spasms. 12/07/12   Glendell Docker, NP  fluconazole (DIFLUCAN) 150 MG tablet Take 1 tablet (150 mg total) by  mouth once. Repeat in one week prn. 11/04/13   Riki Altes, MD  fluconazole (DIFLUCAN) 150 MG tablet Take 1 tablet (150 mg total) by mouth once. 01/14/16 01/21/16  Rasha Ibe, PA-C  gabapentin (NEURONTIN) 600 MG tablet Take 1 tablet (600 mg total) by mouth 3 (three) times daily at 8am, 2pm and bedtime. For anxiety and mood stabilization. 06/14/12 11/04/13  Milana Huntsman Readling, MD  HYDROmorphone (DILAUDID) 4 MG tablet Take 1 tablet (4 mg total) by mouth every 4 (four) hours as needed for severe pain. 11/04/13   Riki Altes, MD  lamoTRIgine (LAMICTAL) 100 MG tablet Take 50 mg by mouth daily. 07/09/12   Historical Provider, MD  lamoTRIgine (LAMICTAL) 25 MG tablet Take 25 mg by mouth daily. For 10 days 06/29/12 07/08/12   Historical Provider, MD  lithium carbonate (LITHOBID) 300 MG CR tablet Take 900 mg by mouth at bedtime.    Historical Provider, MD  metroNIDAZOLE (FLAGYL) 500 MG tablet Take 1 tablet (500 mg total) by mouth 2 (two) times daily. 09/25/12   Calvert Cantor, MD  metroNIDAZOLE (FLAGYL) 500 MG tablet Take 1 tablet (500 mg total) by mouth 2 (two) times daily. 01/14/16   Advaith Lamarque, PA-C  oxyCODONE-acetaminophen (PERCOCET/ROXICET) 5-325 MG per tablet Take 1-2 tablets by mouth every 6 (six) hours as needed for pain. 08/04/12   Calvert Cantor, MD  oxyCODONE-acetaminophen (PERCOCET/ROXICET) 5-325 MG per tablet Take 1-2 tablets by mouth every 6 (six) hours as needed for pain. 09/25/12   Calvert Cantor, MD  oxyCODONE-acetaminophen (PERCOCET/ROXICET) 5-325 MG per tablet Take 2 tablets by mouth every 4 (four) hours as needed for pain. 12/07/12   Glendell Docker, NP  predniSONE (DELTASONE) 20 MG tablet 3 tabs po daily x 2 days 11/04/13   Riki Altes, MD  QUEtiapine (SEROQUEL) 300 MG tablet Take 600 mg by mouth at bedtime.    Historical Provider, MD  traZODone (DESYREL) 100 MG tablet Take 1 tablet (100 mg total) by mouth at bedtime. For sleep. 06/14/12 07/14/12  Milana Huntsman Readling, MD   BP 136/99 mmHg  Pulse 85  Temp(Src) 98.2 F (36.8 C) (Oral)  Resp 18  Ht 5\' 3"  (1.6 m)  Wt 61.236 kg  BMI 23.92 kg/m2  SpO2 100% Physical Exam  Constitutional: She appears well-developed and well-nourished. No distress.  HENT:  Head: Normocephalic and atraumatic.  Eyes: Conjunctivae are normal. Right eye exhibits no discharge. Left eye exhibits no discharge. No scleral icterus.  Neck: Normal range of motion.  Cardiovascular: Normal rate, regular rhythm and intact distal pulses.   Pedal pulses palpable and equal  Pulmonary/Chest: Effort normal. No respiratory distress.  Genitourinary: Cervix exhibits no motion tenderness, no discharge and no friability. Right adnexum displays no tenderness and no fullness. Left adnexum  displays no tenderness and no fullness. No vaginal discharge found.  Malodorous, white discharge in vaginal vault. No CMT or adnexal tenderness or fullness  Musculoskeletal: Normal range of motion.       Right upper leg: She exhibits no tenderness, no swelling and no deformity.       Right lower leg: She exhibits tenderness. She exhibits no swelling and no deformity.       Legs:      Right foot: There is normal range of motion, no tenderness, normal capillary refill and no deformity.  Tenderness at the right medial-posterior calf without swelling or palpable cords. Pt reports a knot here but this is not appreciable on exam. No erythema or streaking noted. No tenderness of the  right upper leg, right ankle or right foot. No swelling or color change noted to the ankle or foot. FROM at the hip, knee and ankle intact.   Lymphadenopathy:       Right: No inguinal adenopathy present.       Left: No inguinal adenopathy present.  Neurological: She is alert. Coordination normal.  5/5 ankle strength bilaterally. Sensation to light touch intact throughout.   Skin: Skin is warm and dry. No erythema.  No color change of the right foot when compared to the left. No signs of IVDU over the lower extremity. No erythema, streaking, induration or fluctuance.   Psychiatric: She has a normal mood and affect. Her behavior is normal.  Nursing note and vitals reviewed.   ED Course  Procedures (including critical care time) Labs Review Labs Reviewed  WET PREP, GENITAL - Abnormal; Notable for the following:    Clue Cells Wet Prep HPF POC PRESENT (*)    WBC, Wet Prep HPF POC MODERATE (*)    All other components within normal limits  CBC WITH DIFFERENTIAL/PLATELET - Abnormal; Notable for the following:    Platelets 145 (*)    All other components within normal limits  BASIC METABOLIC PANEL - Abnormal; Notable for the following:    Chloride 100 (*)    All other components within normal limits  URINALYSIS, ROUTINE  W REFLEX MICROSCOPIC (NOT AT [facility])  GC/CHLAMYDIA PROBE AMP ([place]) NOT AT [facility]    Imaging Review US Venous Img Lower Unilateral Right  01/14/2016  CLINICAL DATA:  Right upper leg pain for 2 weeks. Swelling for 5 days. EXAM: Right LOWER EXTREMITY VENOUS DOPPLER ULTRASOUND TECHNIQUE: Gray-scale sonography with graded compression, as well as color Doppler and duplex ultrasound were performed to evaluate the lower extremity deep venous systems from the level of the common femoral vein and including the common femoral, femoral, profunda femoral, popliteal and calf veins including the posterior tibial, peroneal and gastrocnemius veins when visible. The superficial great saphenous vein was also interrogated. Spectral Doppler was utilized to evaluate flow at rest and with distal augmentation maneuvers in the common femoral, femoral and popliteal veins. COMPARISON:  None. FINDINGS: Contralateral Common Femoral Vein: Respiratory phasicity is normal and symmetric with the symptomatic side. No evidence of thrombus. Normal compressibility. Common Femoral Vein: No evidence of thrombus. Normal compressibility, respiratory phasicity and response to augmentation. Saphenofemoral Junction: No evidence of thrombus. Normal compressibility and flow on color Doppler imaging. Profunda Femoral Vein: No evidence of thrombus. Normal compressibility and flow on color Doppler imaging. Femoral Vein: No evidence of thrombus. Normal compressibility, respiratory phasicity and response to augmentation. Popliteal Vein: No evidence of thrombus. Normal compressibility, respiratory phasicity and response to augmentation. Calf Veins: No evidence of thrombus. Normal compressibility and flow on color Doppler imaging. Superficial Great Saphenous Vein: No evidence of thrombus. Normal compressibility and flow on color Doppler imaging. Venous Reflux:  None. Other Findings:  None. IMPRESSION: No evidence of deep venous thrombosis. Electronically  Signed   By: Lucienne Capers M.D.   On: 01/14/2016 18:41   I have personally reviewed and evaluated these images and lab results as part of my medical decision-making.   EKG Interpretation None      MDM   Final diagnoses:  Right leg pain  BV (bacterial vaginosis)   35 year old female presenting with right leg pain and vaginal discharge. History of IV drug use with most recent use yesterday. Patient injects in her upper extremities but does admit to injecting  in her right lower extremity approximately 2 months ago. Denies history of blood clots. No concern for STDs. Afebrile and nontoxic appearing. Right lower extremities neurovascularly intact with full range of motion. Tenderness to the right posterior calf without erythema or palpable cords. No erythema or streaking of the skin of the right lower extremity. No signs of superficial infection. Patient is able to ambulate with a steady gait. On pelvic exam, malodorous white discharge noted without CMT or adnexal tenderness or fullness. No elevated white count. Blood work is unremarkable. Clue cells present on wet prep. Lower extremity venous ultrasound negative for DVT. At this time, there does not appear to be a medical emergency requiring further evaluation. Will discharge with Flagyl for BV. Discussed conservative therapies for musculoskeletal pain. Patient follow-up with her PCP if this does not improve in the next few days. Return precautions given in discharge paperwork and discussed with pt at bedside. Pt stable for discharge     Josephina Gip, PA-C 01/15/16 0048  Julianne Rice, MD 01/15/16 [phone]

## 2016-01-14 NOTE — ED Notes (Signed)
Patient states that about 2 weeks ago she started to have pain to her right upper leg. The patient reports that she is now having swelling and her foot. The patient reports the her foot looks black now. No noted color change to the patients foot by this RN, pulse is strong and equal to her left foot. The patient also reports that she continues to have the BV that she was treated for recently and did not take her flagyl as ordered the patient also sates that has a yeast infection

## 2016-01-15 LAB — GC/CHLAMYDIA PROBE AMP (~~LOC~~) NOT AT ARMC
Chlamydia: NEGATIVE
Neisseria Gonorrhea: NEGATIVE

## 2016-05-18 ENCOUNTER — Emergency Department (HOSPITAL_BASED_OUTPATIENT_CLINIC_OR_DEPARTMENT_OTHER)
Admission: EM | Admit: 2016-05-18 | Discharge: 2016-05-18 | Disposition: A | Discharge status: Home / Self Care | Payer: Self-pay | Attending: Emergency Medicine | Admitting: Emergency Medicine

## 2016-05-18 ENCOUNTER — Emergency Department (HOSPITAL_BASED_OUTPATIENT_CLINIC_OR_DEPARTMENT_OTHER): Payer: Self-pay

## 2016-05-18 ENCOUNTER — Encounter (HOSPITAL_BASED_OUTPATIENT_CLINIC_OR_DEPARTMENT_OTHER): Payer: Self-pay | Admitting: *Deleted

## 2016-05-18 DIAGNOSIS — S93402A Sprain of unspecified ligament of left ankle, initial encounter: Secondary | ICD-10-CM | POA: Insufficient documentation

## 2016-05-18 DIAGNOSIS — F410 Panic disorder [episodic paroxysmal anxiety] without agoraphobia: Secondary | ICD-10-CM | POA: Insufficient documentation

## 2016-05-18 DIAGNOSIS — N760 Acute vaginitis: Secondary | ICD-10-CM | POA: Insufficient documentation

## 2016-05-18 DIAGNOSIS — X501XXA Overexertion from prolonged static or awkward postures, initial encounter: Secondary | ICD-10-CM | POA: Insufficient documentation

## 2016-05-18 DIAGNOSIS — Y929 Unspecified place or not applicable: Secondary | ICD-10-CM | POA: Insufficient documentation

## 2016-05-18 DIAGNOSIS — F319 Bipolar disorder, unspecified: Secondary | ICD-10-CM | POA: Insufficient documentation

## 2016-05-18 DIAGNOSIS — Z859 Personal history of malignant neoplasm, unspecified: Secondary | ICD-10-CM | POA: Insufficient documentation

## 2016-05-18 DIAGNOSIS — Y999 Unspecified external cause status: Secondary | ICD-10-CM | POA: Insufficient documentation

## 2016-05-18 DIAGNOSIS — J45909 Unspecified asthma, uncomplicated: Secondary | ICD-10-CM | POA: Insufficient documentation

## 2016-05-18 DIAGNOSIS — Y9301 Activity, walking, marching and hiking: Secondary | ICD-10-CM | POA: Insufficient documentation

## 2016-05-18 DIAGNOSIS — L03116 Cellulitis of left lower limb: Secondary | ICD-10-CM | POA: Insufficient documentation

## 2016-05-18 DIAGNOSIS — Z8541 Personal history of malignant neoplasm of cervix uteri: Secondary | ICD-10-CM | POA: Insufficient documentation

## 2016-05-18 DIAGNOSIS — F17210 Nicotine dependence, cigarettes, uncomplicated: Secondary | ICD-10-CM | POA: Insufficient documentation

## 2016-05-18 DIAGNOSIS — B9689 Other specified bacterial agents as the cause of diseases classified elsewhere: Secondary | ICD-10-CM

## 2016-05-18 LAB — URINALYSIS, ROUTINE W REFLEX MICROSCOPIC
Bilirubin Urine: NEGATIVE
Glucose, UA: NEGATIVE mg/dL
HGB URINE DIPSTICK: NEGATIVE
Ketones, ur: 15 mg/dL — AB
Leukocytes, UA: NEGATIVE
NITRITE: NEGATIVE
PROTEIN: NEGATIVE mg/dL
SPECIFIC GRAVITY, URINE: 1.02 (ref 1.005–1.030)
pH: 5.5 (ref 5.0–8.0)

## 2016-05-18 LAB — WET PREP, GENITAL
SPERM: NONE SEEN
TRICH WET PREP: NONE SEEN
YEAST WET PREP: NONE SEEN

## 2016-05-18 MED ORDER — METRONIDAZOLE 1 % EX GEL: Freq: Every day | CUTANEOUS | 0 refills | Status: AC

## 2016-05-18 MED ORDER — METRONIDAZOLE 500 MG PO TABS: 500.0000 mg | ORAL_TABLET | Freq: Two times a day (BID) | ORAL | 0 refills | Status: AC

## 2016-05-18 MED ORDER — CEPHALEXIN 250 MG PO CAPS: 250.0000 mg | ORAL_CAPSULE | Freq: Four times a day (QID) | ORAL | 0 refills | Status: AC

## 2016-05-18 MED ORDER — FLUCONAZOLE 200 MG PO TABS: 200.0000 mg | ORAL_TABLET | Freq: Every day | ORAL | 0 refills | Status: AC

## 2016-05-18 MED ORDER — CYCLOBENZAPRINE HCL 10 MG PO TABS: 10.0000 mg | ORAL_TABLET | Freq: Two times a day (BID) | ORAL | 0 refills | Status: AC | PRN

## 2016-05-18 MED FILL — CEPHALEXIN 250 MG CAPSULE: 250 | 5 days supply | Qty: 20 | Fill #0

## 2016-05-18 MED FILL — CYCLOBENZAPRINE 10 MG TAB: 10 | 10 days supply | Qty: 20 | Fill #0

## 2016-05-18 MED FILL — FLUCONAZOLE 200 MG TABLET: 200 | 3 days supply | Qty: 3 | Fill #0

## 2016-05-18 MED FILL — metroNIDAZOLE 500 MG TABS: 500 | 7 days supply | Qty: 14 | Fill #0

## 2016-05-18 NOTE — ED Notes (Addendum)
Pt requested ortho boot and something to eat. MD aware. No change in orders for brace and pt aware. Pt given crackers at  Discharge and left them in room untouched.  Pt asked where she could get meds for free. Pt informed that meds are low cost at pharmacy that is here.

## 2016-05-18 NOTE — ED Triage Notes (Signed)
Pt c/o left ankle foot  Swelling and redness, from iv drug use x 4 days

## 2016-05-18 NOTE — ED Notes (Addendum)
1st c/o:Pt states she "sprained" her ankle on Sunday today it is red and swollen. Pt endorses IV drug use in that foot as well.   2nd c/o: Pt also has an abscessed area on RUE near axilla, that has induration.   3rd c/o: Vaginal discharge, that is white and malodorous.

## 2016-05-18 NOTE — ED Provider Notes (Signed)
[place] [facility] Provider Note   CSN: [id number] Arrival date & time: 05/18/16  1326  First Provider Contact:  None       History   Chief Complaint Chief Complaint  Patient presents with  . Leg Pain    HPI Alicia Golden is a 35 y.o. female.  The history is provided by the patient.  Leg Pain   This is a new problem. The current episode started 2 days ago. The problem occurs constantly. The problem has been gradually worsening. The pain is present in the left ankle. The quality of the pain is described as aching, pounding and constant. The pain is at a severity of 9/10. The pain is severe. Associated symptoms include limited range of motion and stiffness. The symptoms are aggravated by activity. She has tried nothing for the symptoms. The treatment provided no relief. There has been a history of trauma (walking down and hill and twisted the ankle with severe pain and swelling and difficulty walking since).    Past Medical History:  Diagnosis Date  . Asthma   . Bipolar disorder ([place])   . Bipolar I disorder, in partial remission ([place]) 06/14/2012  . Cancer ([place])   . Cervical cancer ([place])   . Eczema   . Hepatitis C   . Multiple personality disorder   . Panic attacks   . Substance abuse    heroin    Patient Active Problem List   Diagnosis Date Noted  . Polysubstance dependence including opioid type drug, continuous use ([place]) 06/14/2012  . Bipolar I disorder, in partial remission ([place]) 06/14/2012  . Borderline personality disorder 06/14/2012    History reviewed. No pertinent surgical history.  OB History    Gravida Para Term Preterm AB Living   1             SAB TAB Ectopic Multiple Live Births                   Home Medications    Prior to Admission medications   Medication Sig Start Date End Date Taking? Authorizing Provider  Aspirin-Salicylamide-Caffeine (BC HEADACHE POWDER PO) Take by mouth.    Historical Provider, MD  cephALEXin (KEFLEX) 250 MG  capsule Take 1 capsule (250 mg total) by mouth 4 (four) times daily. 05/18/16   Blanchie Dessert, MD  cyclobenzaprine (FLEXERIL) 10 MG tablet Take 1 tablet (10 mg total) by mouth 2 (two) times daily as needed for muscle spasms. 05/18/16   Blanchie Dessert, MD  fluconazole (DIFLUCAN) 200 MG tablet Take 1 tablet (200 mg total) by mouth daily. 05/18/16   Blanchie Dessert, MD  gabapentin (NEURONTIN) 600 MG tablet Take 1 tablet (600 mg total) by mouth 3 (three) times daily at 8am, 2pm and bedtime. For anxiety and mood stabilization. 06/14/12 11/04/13  Mellissa Kohut, MD  methadone (DOLOPHINE) 10 MG tablet Take 180 mg by mouth daily.     Historical Provider, MD  metroNIDAZOLE (FLAGYL) 500 MG tablet Take 1 tablet (500 mg total) by mouth 2 (two) times daily. 05/18/16   Blanchie Dessert, MD  metroNIDAZOLE (METROGEL) 1 % gel Apply topically daily. 05/18/16   Blanchie Dessert, MD    Family History No family history on file.  Social History Social History  Substance Use Topics  . Smoking status: Current Every Day Smoker    Packs/day: 1.00    Types: Cigarettes  . Smokeless tobacco: Not on file  . Alcohol use Yes     Comment: occasional  Allergies   Vicodin [hydrocodone-acetaminophen]   Review of Systems Review of Systems  Genitourinary:       Hx of bacterial vaginosis and worsening discharge like her BV over the last few days.  No itching burning or difficulty urinating.    Musculoskeletal: Positive for stiffness.       Pt abuses heroin and has a red area over the left shin from shooting up with some tenderness but no drainage.     Physical Exam Updated Vital Signs BP 108/67 (BP Location: Left Arm)   Pulse 80   Temp 98 F (36.7 C) (Oral)   Resp 20   Ht 5\' 6"  (1.676 m)   Wt 140 lb (63.5 kg)   SpO2 99%   BMI 22.60 kg/m   Physical Exam  Constitutional: She is oriented to person, place, and time. She appears well-developed and well-nourished. No distress.  HENT:  Head: Normocephalic  and atraumatic.  Mouth/Throat: Oropharynx is clear and moist.  Eyes: Conjunctivae and EOM are normal. Pupils are equal, round, and reactive to light.  Neck: Normal range of motion. Neck supple.  Cardiovascular: Normal rate, regular rhythm and intact distal pulses.   No murmur heard. Pulmonary/Chest: Effort normal and breath sounds normal. No respiratory distress. She has no wheezes. She has no rales.  Abdominal: Soft. She exhibits no distension. There is no tenderness. There is no rebound and no guarding.  Genitourinary: Uterus normal. Cervix exhibits discharge. Cervix exhibits no motion tenderness and no friability. Right adnexum displays no mass, no tenderness and no fullness. Left adnexum displays no mass, no tenderness and no fullness. No tenderness in the vagina. Vaginal discharge found.  Musculoskeletal: She exhibits tenderness. She exhibits no edema.       Left ankle: She exhibits decreased range of motion, swelling and ecchymosis. Tenderness. Lateral malleolus and head of 5th metatarsal tenderness found. No proximal fibula tenderness found. Achilles tendon normal.  Neurological: She is alert and oriented to person, place, and time.  Skin: Skin is warm and dry. No rash noted. There is erythema.     Psychiatric: She has a normal mood and affect. Her behavior is normal.  Nursing note and vitals reviewed.    ED Treatments / Results  Labs (all labs ordered are listed, but only abnormal results are displayed) Labs Reviewed  WET PREP, GENITAL - Abnormal; Notable for the following:       Result Value   Clue Cells Wet Prep HPF POC PRESENT (*)    WBC, Wet Prep HPF POC MANY (*)    All other components within normal limits  URINALYSIS, ROUTINE W REFLEX MICROSCOPIC (NOT AT [facility]) - Abnormal; Notable for the following:    Color, Urine AMBER (*)    Ketones, ur 15 (*)    All other components within normal limits  HIV ANTIBODY (ROUTINE TESTING)  RPR  GC/CHLAMYDIA PROBE AMP ([place]) NOT  AT [facility]    EKG  EKG Interpretation None       Radiology Dg Ankle Complete Left  Result Date: 05/18/2016 CLINICAL DATA:  Twisted ankle 3 days ago. Lateral pain and swelling. EXAM: LEFT ANKLE COMPLETE - 3+ VIEW COMPARISON:  None. FINDINGS: Lateral soft tissue swelling.  No fracture or dislocation. IMPRESSION: Lateral soft tissue swelling. Electronically Signed   By: Nelson Chimes M.D.   On: 05/18/2016 14:52  Dg Foot Complete Left  Result Date: 05/18/2016 CLINICAL DATA:  Twisted ankle 3 days ago.  Lateral pain. EXAM: LEFT FOOT - COMPLETE 3+ VIEW  COMPARISON:  None. FINDINGS: There is no evidence of fracture or dislocation. There is no evidence of arthropathy or other focal bone abnormality. Soft tissues are unremarkable. IMPRESSION: Negative. Electronically Signed   By: Nelson Chimes M.D.   On: 05/18/2016 14:53   Procedures Procedures (including critical care time)  Medications Ordered in ED Medications - No data to display   Initial Impression / Assessment and Plan / ED Course  I have reviewed the triage vital signs and the nursing notes.  Pertinent labs & imaging results that were available during my care of the patient were reviewed by me and considered in my medical decision making (see chart for details).  Clinical Course    Patient presenting with multiple complaints. Initial complaint was of left ankle pain that occurred several days ago when she twisted her ankle. She has significant swelling and ecchymosis over the lateral malleolus and proximal foot. She is neurovascularly intact and no signs of infection. Plain films are negative so this is a bad sprain. Patient was placed in a Aircast and given crutches. Secondly patient is a heroin abuser and also receives methadone. She shot up last week and has noticed some redness and swelling of her left shin. There is a small area of cellulitis but no definable abscess to drain at this time and she was started on Keflex Lastly patient  is complaining of 3-4 days of increased vaginal discharge which she thinks is similar to her prior episodes of BV. She has not been sexually active for over a month because her boyfriend is in jail. She denies any itching or burning or urinary symptoms. On pelvic exam she had mild vaginal discharge but no concerning findings for infection. Wet prep is positive for white blood cells inclusive. Patient was treated with metronidazole. She also requests a prescription for Diflucan as she often will get a yeast infection after taking antibiotics.  Final Clinical Impressions(s) / ED Diagnoses   Final diagnoses:  Bacterial vaginosis  Cellulitis of left lower extremity  Left ankle sprain, initial encounter    New Prescriptions New Prescriptions   CEPHALEXIN (KEFLEX) 250 MG CAPSULE    Take 1 capsule (250 mg total) by mouth 4 (four) times daily.   CYCLOBENZAPRINE (FLEXERIL) 10 MG TABLET    Take 1 tablet (10 mg total) by mouth 2 (two) times daily as needed for muscle spasms.   FLUCONAZOLE (DIFLUCAN) 200 MG TABLET    Take 1 tablet (200 mg total) by mouth daily.   METRONIDAZOLE (FLAGYL) 500 MG TABLET    Take 1 tablet (500 mg total) by mouth 2 (two) times daily.   METRONIDAZOLE (METROGEL) 1 % GEL    Apply topically daily.     Blanchie Dessert, MD 05/18/16 1537

## 2016-05-19 LAB — RPR: RPR: NONREACTIVE

## 2016-05-19 LAB — GC/CHLAMYDIA PROBE AMP (~~LOC~~) NOT AT ARMC
CHLAMYDIA, DNA PROBE: NEGATIVE
NEISSERIA GONORRHEA: NEGATIVE

## 2016-05-19 LAB — HIV ANTIBODY (ROUTINE TESTING W REFLEX): HIV SCREEN 4TH GENERATION: NONREACTIVE

## 2017-08-10 IMAGING — US US EXTREM LOW VENOUS*R*
1 series · 13 of 24 positions shown · non-contrast
Comparison: None.

CLINICAL DATA: Right upper leg pain for 2 weeks. Swelling for 5
days.



[Series 1: us extrem low venous*right* · 0.05mm/px · 13 of 26 slices shown]
[im 1/26]
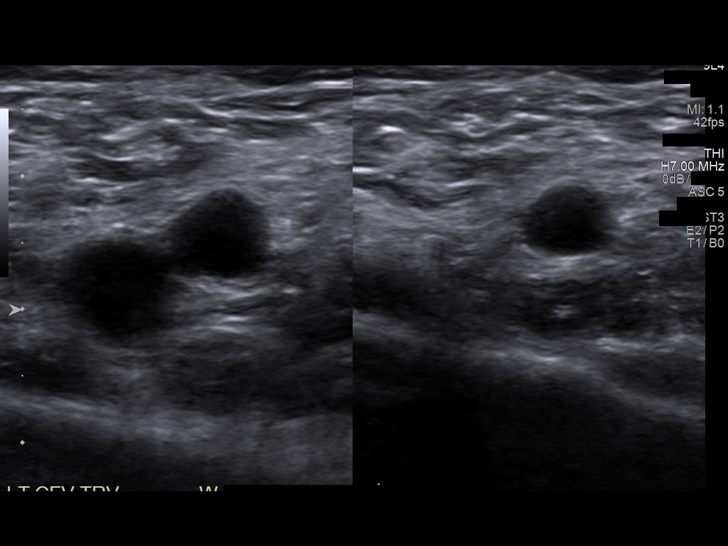
[im 3/26]
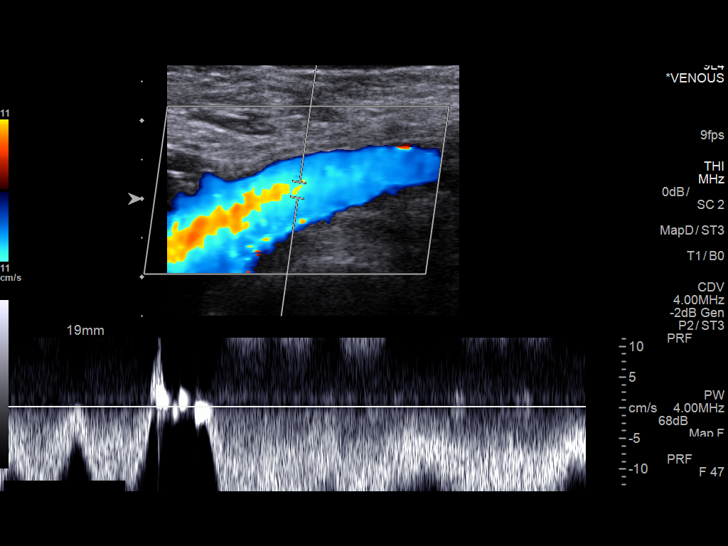
[im 5/26]
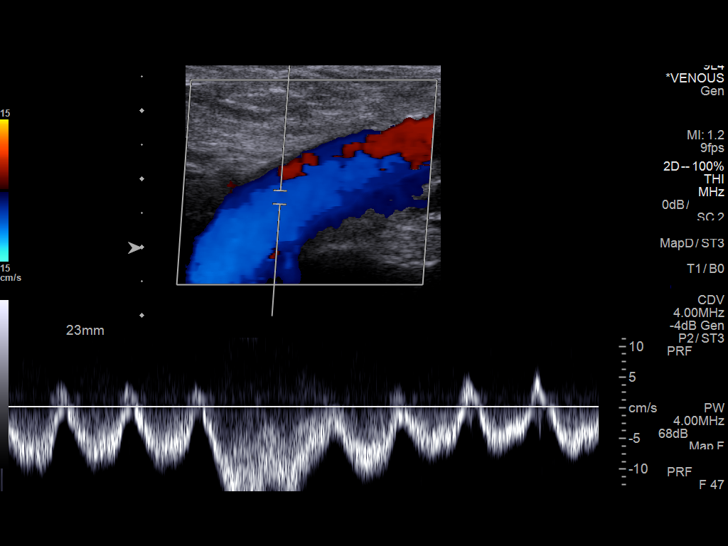
[im 7/26]
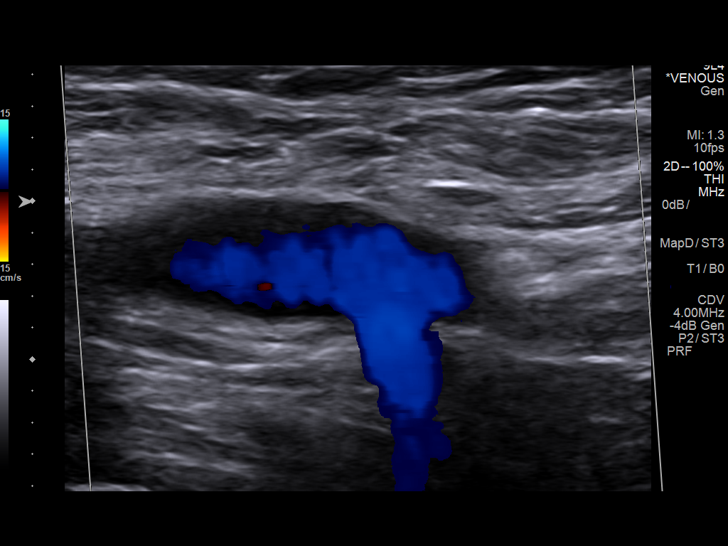
[im 9/26]
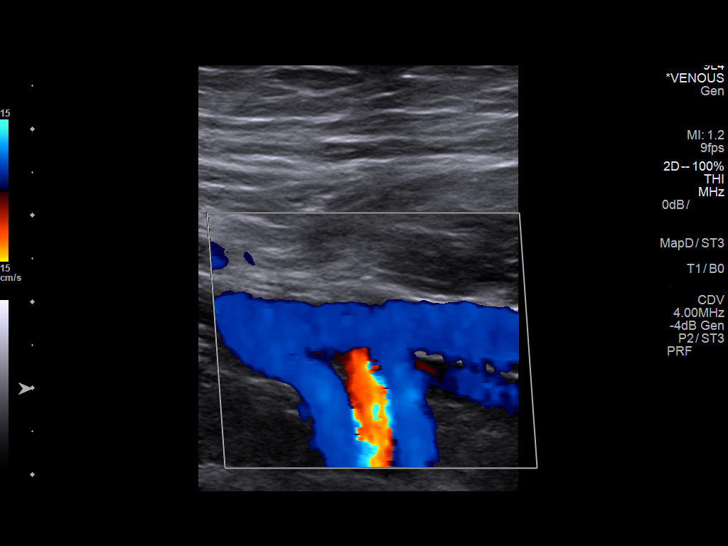
[im 11/26]
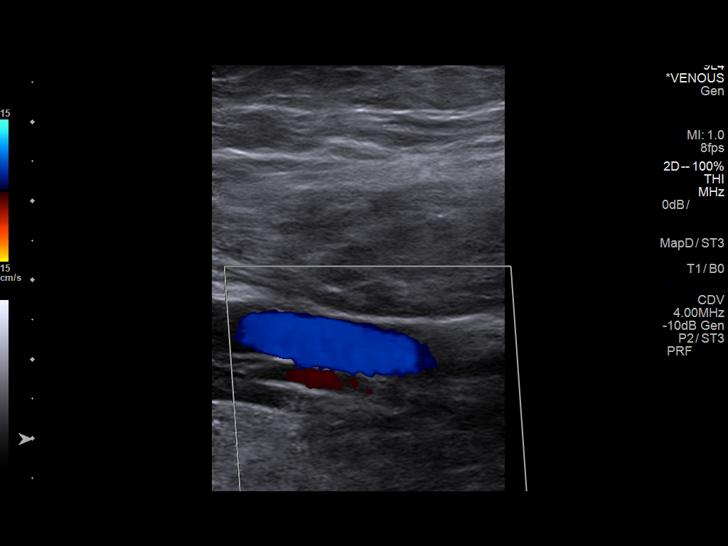
[im 14/26]
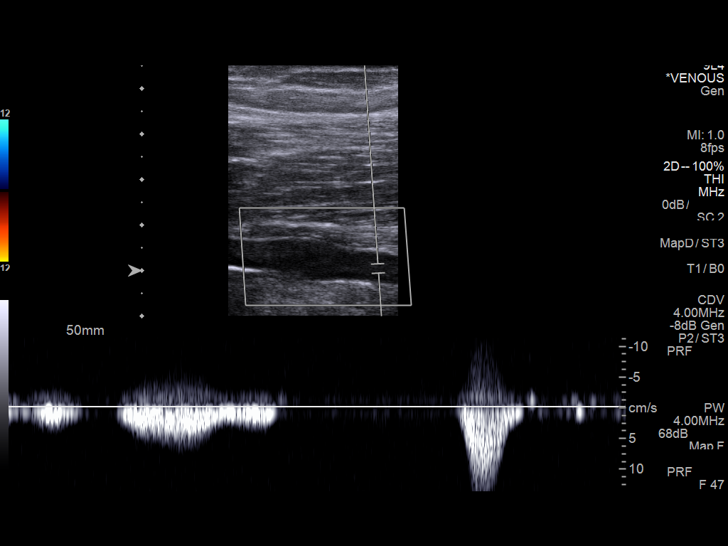
[im 15/26]
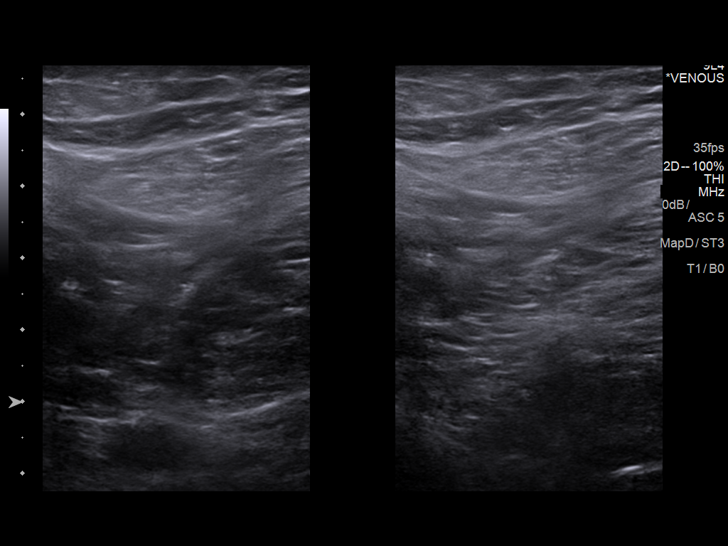
[im 17/26]
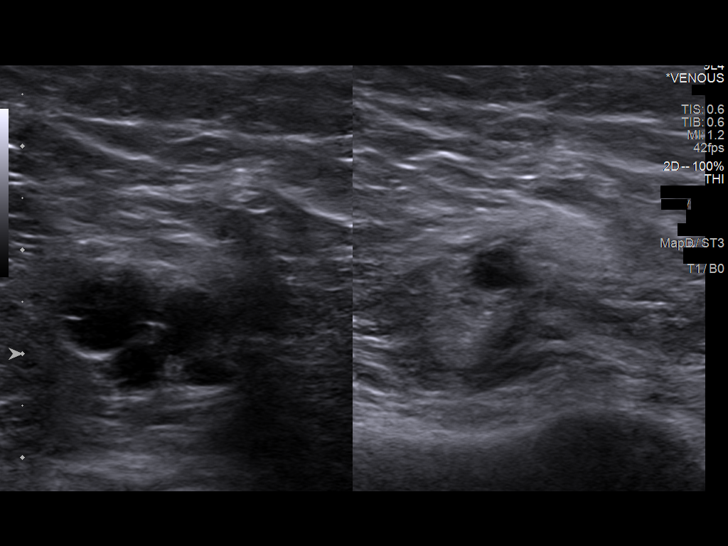
[im 19/26]
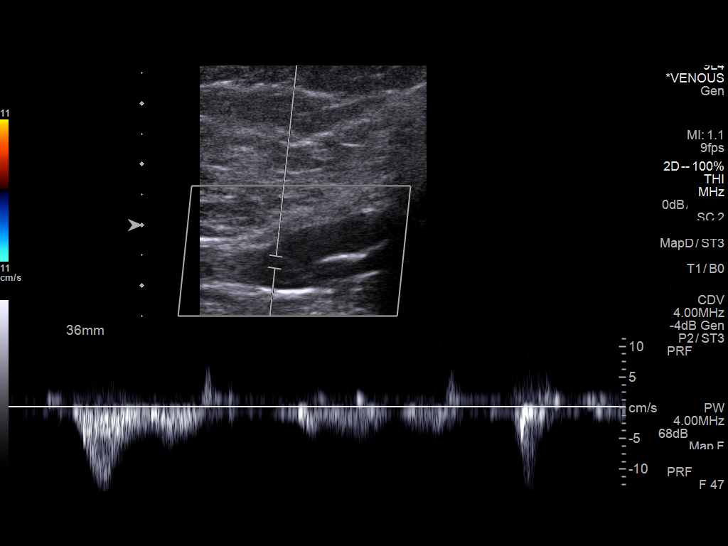
[im 21/26]
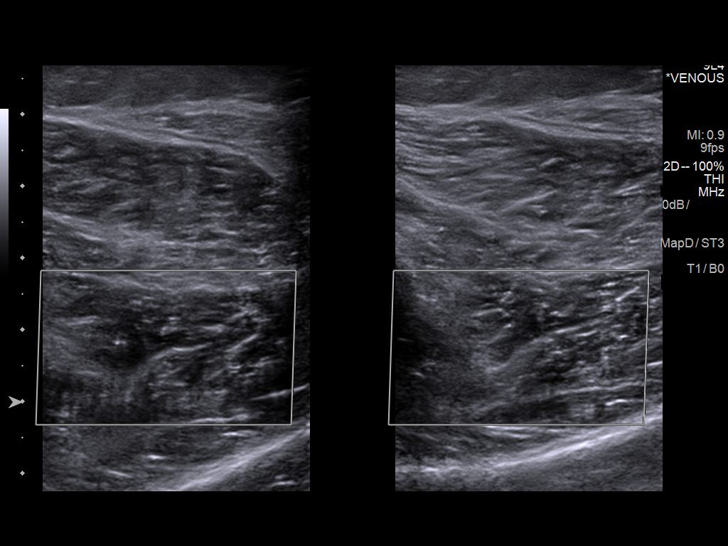
[im 23/26]
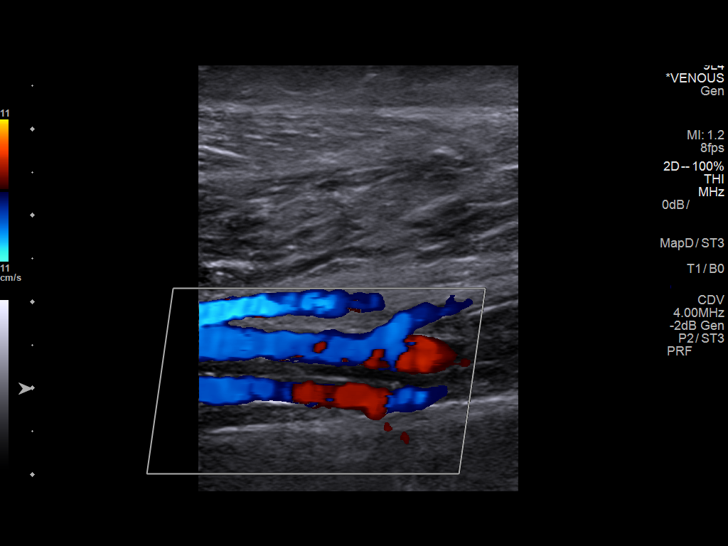
[im 26/26]
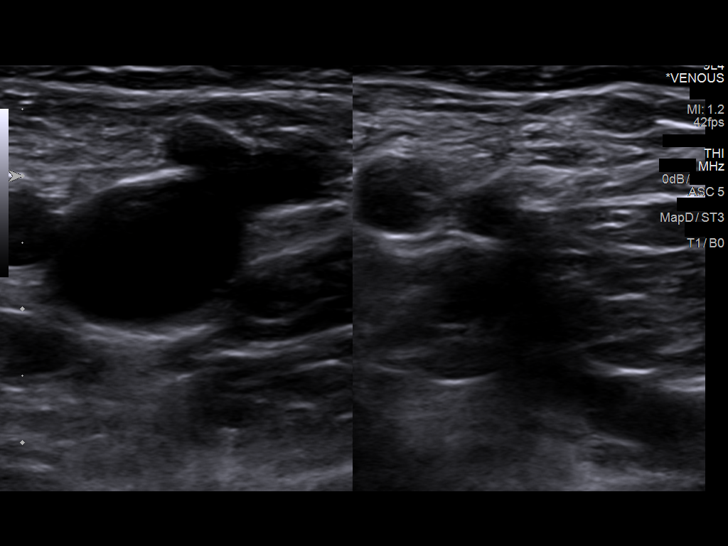

[13 of 24 positions shown; findings below may reference images not displayed]

FINDINGS: Contralateral Common Femoral Vein: Respiratory phasicity is normal
and symmetric with the symptomatic side. No evidence of thrombus.
Normal compressibility.

Common Femoral Vein: No evidence of thrombus. Normal
compressibility, respiratory phasicity and response to augmentation.

Saphenofemoral Junction: No evidence of thrombus. Normal
compressibility and flow on color Doppler imaging.

Profunda Femoral Vein: No evidence of thrombus. Normal
compressibility and flow on color Doppler imaging.

Femoral Vein: No evidence of thrombus. Normal compressibility,
respiratory phasicity and response to augmentation.

Popliteal Vein: No evidence of thrombus. Normal compressibility,
respiratory phasicity and response to augmentation.

Calf Veins: No evidence of thrombus. Normal compressibility and flow
on color Doppler imaging.

Superficial Great Saphenous Vein: No evidence of thrombus. Normal
compressibility and flow on color Doppler imaging.

Venous Reflux:  None.

Other Findings:  None.
IMPRESSION: No evidence of deep venous thrombosis.

## 2021-12-17 ENCOUNTER — Encounter (HOSPITAL_BASED_OUTPATIENT_CLINIC_OR_DEPARTMENT_OTHER): Payer: Self-pay | Admitting: Emergency Medicine

## 2021-12-17 ENCOUNTER — Other Ambulatory Visit: Payer: Self-pay

## 2021-12-17 ENCOUNTER — Emergency Department (HOSPITAL_BASED_OUTPATIENT_CLINIC_OR_DEPARTMENT_OTHER)
Admission: EM | Admit: 2021-12-17 | Discharge: 2021-12-17 | Discharge status: Against Medical Advice | Payer: Self-pay | Attending: Emergency Medicine | Admitting: Emergency Medicine

## 2021-12-17 DIAGNOSIS — Z202 Contact with and (suspected) exposure to infections with a predominantly sexual mode of transmission: Secondary | ICD-10-CM | POA: Insufficient documentation

## 2021-12-17 DIAGNOSIS — N6310 Unspecified lump in the right breast, unspecified quadrant: Secondary | ICD-10-CM | POA: Insufficient documentation

## 2021-12-17 DIAGNOSIS — N898 Other specified noninflammatory disorders of vagina: Secondary | ICD-10-CM | POA: Insufficient documentation

## 2021-12-17 DIAGNOSIS — Z5321 Procedure and treatment not carried out due to patient leaving prior to being seen by health care provider: Secondary | ICD-10-CM | POA: Insufficient documentation

## 2021-12-17 LAB — URINALYSIS, ROUTINE W REFLEX MICROSCOPIC
Bilirubin Urine: NEGATIVE
Glucose, UA: NEGATIVE mg/dL
Hgb urine dipstick: NEGATIVE
Ketones, ur: NEGATIVE mg/dL
Leukocytes,Ua: NEGATIVE
Nitrite: NEGATIVE
Protein, ur: NEGATIVE mg/dL
Specific Gravity, Urine: >1.03 — ABNORMAL HIGH (ref 1.005–1.030)
pH: 5.5 (ref 5.0–8.0)

## 2021-12-17 LAB — PREGNANCY, URINE: Preg Test, Ur: NEGATIVE

## 2021-12-17 NOTE — ED Triage Notes (Signed)
Reports lump on right breast x 5 years , Here for STD check , vaginal discharge . Also wanted to be check for hernia

## 2021-12-17 NOTE — ED Notes (Signed)
Pt refuses to be roomed , pt sitting with another patient who is admitted to ED. Pt wish not to be seen anymore . Signing AMA

## 2021-12-17 NOTE — ED Notes (Signed)
Unsuccessful blood draw in [facility]. RN notified.
